# Patient Record
Sex: Male | Born: 1985 | Race: White | Hispanic: No | Marital: Married | Smoking: Current every day smoker
Health system: Southern US, Community
[De-identification: ages and names within clinical notes are randomized; demographics above are authoritative.]

## PROBLEM LIST (undated history)

## (undated) DIAGNOSIS — K219 Gastro-esophageal reflux disease without esophagitis: Secondary | ICD-10-CM

## (undated) DIAGNOSIS — F909 Attention-deficit hyperactivity disorder, unspecified type: Secondary | ICD-10-CM

## (undated) HISTORY — PX: TYMPANOPLASTY: SHX33

## (undated) HISTORY — PX: WISDOM TOOTH EXTRACTION: SHX21

---
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
## 2006-09-23 ENCOUNTER — Emergency Department (HOSPITAL_COMMUNITY): Admission: EM | Admit: 2006-09-23 | Discharge: 2006-09-23 | Payer: Self-pay | Admitting: Emergency Medicine

## 2009-07-06 ENCOUNTER — Emergency Department (HOSPITAL_COMMUNITY): Admission: EM | Admit: 2009-07-06 | Discharge: 2009-07-06 | Payer: Self-pay | Admitting: Emergency Medicine

## 2009-12-28 ENCOUNTER — Ambulatory Visit (HOSPITAL_COMMUNITY): Admission: RE | Admit: 2009-12-28 | Discharge: 2009-12-28 | Payer: Self-pay | Admitting: Psychiatry

## 2009-12-29 ENCOUNTER — Emergency Department (HOSPITAL_COMMUNITY)
Admission: EM | Admit: 2009-12-29 | Discharge: 2009-12-29 | Payer: Self-pay | Source: Home / Self Care | Admitting: Emergency Medicine

## 2010-04-03 ENCOUNTER — Emergency Department (HOSPITAL_COMMUNITY)
Admission: EM | Admit: 2010-04-03 | Discharge: 2010-04-03 | Payer: Self-pay | Source: Home / Self Care | Admitting: Emergency Medicine

## 2010-05-26 ENCOUNTER — Emergency Department (HOSPITAL_COMMUNITY)
Admission: EM | Admit: 2010-05-26 | Discharge: 2010-05-26 | Disposition: A | Discharge status: Home / Self Care | Payer: Self-pay | Attending: Emergency Medicine | Admitting: Emergency Medicine

## 2010-05-26 DIAGNOSIS — R220 Localized swelling, mass and lump, head: Secondary | ICD-10-CM | POA: Insufficient documentation

## 2010-05-26 DIAGNOSIS — K089 Disorder of teeth and supporting structures, unspecified: Secondary | ICD-10-CM | POA: Insufficient documentation

## 2010-05-26 DIAGNOSIS — R221 Localized swelling, mass and lump, neck: Secondary | ICD-10-CM | POA: Insufficient documentation

## 2010-05-26 DIAGNOSIS — K047 Periapical abscess without sinus: Secondary | ICD-10-CM | POA: Insufficient documentation

## 2010-05-26 DIAGNOSIS — K0381 Cracked tooth: Secondary | ICD-10-CM | POA: Insufficient documentation

## 2010-07-01 LAB — RAPID URINE DRUG SCREEN, HOSP PERFORMED
Amphetamines: NOT DETECTED
Barbiturates: NOT DETECTED
Benzodiazepines: NOT DETECTED
Cocaine: NOT DETECTED
Opiates: NOT DETECTED
Tetrahydrocannabinol: NOT DETECTED

## 2010-07-01 LAB — DIFFERENTIAL
Basophils Absolute: 0 10*3/uL (ref 0.0–0.1)
Basophils Relative: 0 % (ref 0–1)
Eosinophils Absolute: 0.2 10*3/uL (ref 0.0–0.7)
Monocytes Relative: 8 % (ref 3–12)
Neutro Abs: 8.4 10*3/uL — ABNORMAL HIGH (ref 1.7–7.7)
Neutrophils Relative %: 70 % (ref 43–77)

## 2010-07-01 LAB — BASIC METABOLIC PANEL
BUN: 10 mg/dL (ref 6–23)
CO2: 30 mEq/L (ref 19–32)
Calcium: 9.3 mg/dL (ref 8.4–10.5)
Chloride: 104 mEq/L (ref 96–112)
Creatinine, Ser: 1.07 mg/dL (ref 0.4–1.5)
GFR calc Af Amer: >60 mL/min (ref >60)
GFR calc non Af Amer: >60 mL/min (ref >60)
Glucose, Bld: 80 mg/dL (ref 70–99)
Potassium: 4.7 mEq/L (ref 3.5–5.1)
Sodium: 140 mEq/L (ref 135–145)

## 2010-07-01 LAB — CBC
HCT: 45.8 % (ref 39.0–52.0)
Hemoglobin: 16 g/dL (ref 13.0–17.0)
MCH: 31 pg (ref 26.0–34.0)
MCHC: 35 g/dL (ref 30.0–36.0)
MCV: 88.7 fL (ref 78.0–100.0)
Platelets: 293 K/uL (ref 150–400)
RBC: 5.16 MIL/uL (ref 4.22–5.81)
RDW: 13.8 % (ref 11.5–15.5)
WBC: 12.1 K/uL — ABNORMAL HIGH (ref 4.0–10.5)

## 2010-07-01 LAB — ETHANOL: Alcohol, Ethyl (B): <5 mg/dL (ref 0–10)

## 2010-07-12 LAB — URINALYSIS, ROUTINE W REFLEX MICROSCOPIC
Hgb urine dipstick: NEGATIVE
Nitrite: NEGATIVE
Protein, ur: NEGATIVE mg/dL
Specific Gravity, Urine: >1.03 — ABNORMAL HIGH (ref 1.005–1.030)
Urobilinogen, UA: 0.2 mg/dL (ref 0.0–1.0)

## 2010-08-05 ENCOUNTER — Emergency Department (HOSPITAL_COMMUNITY)
Admission: EM | Admit: 2010-08-05 | Discharge: 2010-08-05 | Disposition: A | Discharge status: Home / Self Care | Payer: Self-pay | Attending: Emergency Medicine | Admitting: Emergency Medicine

## 2010-08-05 DIAGNOSIS — K029 Dental caries, unspecified: Secondary | ICD-10-CM | POA: Insufficient documentation

## 2010-08-18 ENCOUNTER — Emergency Department (HOSPITAL_COMMUNITY)
Admission: EM | Admit: 2010-08-18 | Discharge: 2010-08-18 | Disposition: A | Discharge status: Home / Self Care | Payer: Self-pay | Attending: Emergency Medicine | Admitting: Emergency Medicine

## 2010-08-18 DIAGNOSIS — K029 Dental caries, unspecified: Secondary | ICD-10-CM | POA: Insufficient documentation

## 2010-08-18 DIAGNOSIS — K089 Disorder of teeth and supporting structures, unspecified: Secondary | ICD-10-CM | POA: Insufficient documentation
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
## 2010-09-03 NOTE — Discharge Summary (Signed)
NAME:  Dustin Rhodes, Dustin Rhodes                      ACCOUNT NO.:  [id number]   MEDICAL RECORD NO.:  [id number]                   PATIENT TYPE:  INP   LOCATION:  0203                                 FACILITY:  [facility]   PHYSICIAN:  Cindie Crumbly, M.D.               DATE OF BIRTH:  [birth year]   DATE OF ADMISSION:  07/26/2002  DATE OF DISCHARGE:  08/02/2002                                 DISCHARGE SUMMARY   REASON FOR ADMISSION:  This 25 year old white male was admitted for  inpatient psychiatric stabilization because of increasing symptoms of  depression and an attempt to overdose.  For further history of present  illness, please see the patient's psychiatric admission assessment.   PHYSICAL EXAMINATION:  At the time of admission was significant for obesity.  He had an otherwise unremarkable physical examination.   LABORATORY DATA:  The patient underwent a laboratory workup to rule out any  other medical problems contributing to his symptomatology.  Urine probe for  gonorrhea and chlamydia were negative.  Hepatic panel was within normal  limits.  UA was unremarkable.  CBC showed MCV of 80.6 and was otherwise  unremarkable.  GGT was within normal limits.  Free T4 and TSH were within  normal limits.  RPR was nonreactive.  The patient received no x-rays, no  special procedures, no additional consultations.  He sustained no  complications during the course of this hospitalization.   HOSPITAL COURSE:  On admission, the patient was psychomotor agitated.  Affect and mood were depressed, irritable and angry.  He was superficial,  guarded and avoidant.  He has remained in denial of his chemical dependency  issues throughout his hospital course.  He was begun on a trial of Zoloft  and tolerated this medication well without side effects.  At the time of  discharge, he denies any suicidal or homicidal ideation.  He is actively  participating in all aspects of the therapeutic treatment program.  He  continues to remain in denial of his chemical dependency issues.  However,  as he no longer appears to be a danger to himself or others, it is felt that  he has reached his maximum benefits of hospitalization and is ready for  discharge to a less restrictive alternative setting.   CONDITION ON DISCHARGE:  Improved.   DIAGNOSES (ACCORDING TO DSM-IV):   AXIS I:  1. Major depression, recurrent, severe without psychosis.  2. Rule out substance-induced mood disorder.  3. Conduct disorder.  4. Attention-deficit hyperactivity disorder.  5. Polysubstance dependence.   AXIS II:  Rule out personality disorder not otherwise specified.   AXIS III:  Obesity.   AXIS IV:  Severe.   AXIS V:  20 on admission; 30 on discharge.   FURTHER EVALUATION AND TREATMENT RECOMMENDATIONS:  1. The patient is discharged to home.  2. He is discharged on an unrestricted level of activity and a regular diet.  3. He is discharged  on Zoloft 50 mg p.o. q.d.  4. He will follow up with Dr. Mariana Single, his outpatient psychiatrist, at     [facility] for all further aspects of     his psychiatric care and, consequently, I will sign off on the case at     this time.                                               Cindie Crumbly, M.D.    TS/MEDQ  D:  08/02/2002  T:  08/03/2002  Job:  [id number]

## 2010-09-14 ENCOUNTER — Emergency Department (HOSPITAL_COMMUNITY)
Admission: EM | Admit: 2010-09-14 | Discharge: 2010-09-14 | Disposition: A | Discharge status: Home / Self Care | Payer: Self-pay | Attending: Emergency Medicine | Admitting: Emergency Medicine

## 2010-09-14 DIAGNOSIS — K089 Disorder of teeth and supporting structures, unspecified: Secondary | ICD-10-CM | POA: Insufficient documentation

## 2010-09-14 DIAGNOSIS — K047 Periapical abscess without sinus: Secondary | ICD-10-CM | POA: Insufficient documentation

## 2010-10-16 ENCOUNTER — Emergency Department (HOSPITAL_COMMUNITY)
Admission: EM | Admit: 2010-10-16 | Discharge: 2010-10-16 | Disposition: A | Discharge status: Home / Self Care | Payer: Self-pay | Attending: Emergency Medicine | Admitting: Emergency Medicine

## 2010-10-16 DIAGNOSIS — K029 Dental caries, unspecified: Secondary | ICD-10-CM | POA: Insufficient documentation

## 2010-10-16 DIAGNOSIS — L03211 Cellulitis of face: Secondary | ICD-10-CM | POA: Insufficient documentation

## 2010-10-16 DIAGNOSIS — L0201 Cutaneous abscess of face: Secondary | ICD-10-CM | POA: Insufficient documentation

## 2010-11-05 ENCOUNTER — Emergency Department (HOSPITAL_COMMUNITY)
Admission: EM | Admit: 2010-11-05 | Discharge: 2010-11-05 | Disposition: A | Discharge status: Home / Self Care | Payer: Self-pay | Attending: Emergency Medicine | Admitting: Emergency Medicine

## 2010-11-05 DIAGNOSIS — L738 Other specified follicular disorders: Secondary | ICD-10-CM | POA: Insufficient documentation

## 2010-11-05 DIAGNOSIS — F17200 Nicotine dependence, unspecified, uncomplicated: Secondary | ICD-10-CM | POA: Insufficient documentation

## 2010-11-05 DIAGNOSIS — K029 Dental caries, unspecified: Secondary | ICD-10-CM | POA: Insufficient documentation

## 2010-11-05 MED ORDER — CLINDAMYCIN HCL 150 MG PO CAPS
150.0000 mg | ORAL_CAPSULE | Freq: Once | ORAL | Status: AC
  Administered 2010-11-05: 150 mg via ORAL
  Filled 2010-11-05: qty 1

## 2010-11-05 MED ORDER — CLINDAMYCIN HCL 150 MG PO CAPS: 150.0000 mg | ORAL_CAPSULE | Freq: Four times a day (QID) | ORAL | Status: AC

## 2010-11-05 NOTE — ED Notes (Signed)
Pt presents with abscess to left side of jaw. Abscess appears to be on skin. Pt states he was at work and brushed his skin against shirt and blood and Pus started draining from jaw.

## 2010-11-05 NOTE — ED Notes (Signed)
Pt states he has a sore on the left side of his face that popped while he was at work. States he was told he had to come to the ed by his place of employment or he would be fired

## 2010-11-05 NOTE — ED Provider Notes (Signed)
History     Chief Complaint  Patient presents with  . Abscess   Patient is a 25 y.o. male presenting with abscess. The history is provided by the patient.  Abscess  This is a recurrent problem. Episode onset: He has an abscess of his left lower jawline in his beard.  It has been present for several weeks. It started draining today at work,  and did drain a moderate amount of pus.  He denies significant pain.  Has left lower fractured tooth with abscess,. Episode frequency: He was treated with amoxicillin for dental infection last week.  Still has not called his dentist for further treatment. The problem has been unchanged. The abscess is present on the face. Pertinent negatives include no fever, no congestion and no sore throat.    History reviewed. No pertinent past medical history.  History reviewed. No pertinent past surgical history.  History reviewed. No pertinent family history.  History  Substance Use Topics  . Smoking status: Current Everyday Smoker -- 1.0 packs/day  . Smokeless tobacco: Not on file  . Alcohol Use: No      Review of Systems  Constitutional: Negative for fever.  HENT: Positive for facial swelling. Negative for congestion, sore throat and neck pain.   Eyes: Negative.   Respiratory: Negative for chest tightness and shortness of breath.   Cardiovascular: Negative for chest pain.  Gastrointestinal: Negative for nausea and abdominal pain.  Genitourinary: Negative.   Musculoskeletal: Negative for joint swelling and arthralgias.  Skin: Negative.  Negative for rash and wound.  Neurological: Negative for dizziness, weakness, light-headedness, numbness and headaches.  Hematological: Negative.   Psychiatric/Behavioral: Negative.     Physical Exam  BP 140/86  Pulse 78  Temp(Src) 98.3 F (36.8 C) (Oral)  Resp 16  Ht 5\' 8"  (1.727 m)  Wt 207 lb (93.895 kg)  BMI 31.47 kg/m2  SpO2 100%  Physical Exam  Vitals reviewed. Constitutional: He is oriented to  person, place, and time. He appears well-developed and well-nourished.  HENT:  Head: Normocephalic and atraumatic.  Mouth/Throat: Uvula is midline and oropharynx is clear and moist. Dental caries present. No dental abscesses.       No appreciable mucosal swelling or induration.  Left lower 3rd molar with decay.  Eyes: Conjunctivae are normal.  Neck: Normal range of motion. Neck supple. No thyromegaly present.  Cardiovascular: Normal rate, regular rhythm, normal heart sounds and intact distal pulses.   Pulmonary/Chest: Effort normal and breath sounds normal. He has no wheezes.  Abdominal: Soft. Bowel sounds are normal. There is no tenderness.  Musculoskeletal: Normal range of motion.  Neurological: He is alert and oriented to person, place, and time.  Skin: Skin is warm and dry.       Two nodules left jawline,  Nontender,  One with large central punctum no active drainage,  Indurated without fluctuance.  0.5 cm diameter.  Psychiatric: He has a normal mood and affect.    ED Course  Procedures  MDM Small papule which has already actively drained,  No indication for I and D today.  Given recent dental history ,  Will cover with clindamycin.      Candis Musa, PA 11/05/10 [phone]

## 2010-12-08 NOTE — ED Provider Notes (Signed)
Medical screening examination/treatment/procedure(s) were performed by non-physician practitioner and as supervising physician I was immediately available for consultation/collaboration.   Sahasra Belue L Angelgabriel Willmore, MD 12/08/10 0908 

## 2011-01-03 ENCOUNTER — Emergency Department (HOSPITAL_COMMUNITY)
Admission: EM | Admit: 2011-01-03 | Discharge: 2011-01-03 | Disposition: A | Discharge status: Home / Self Care | Payer: Self-pay | Attending: Emergency Medicine | Admitting: Emergency Medicine

## 2011-01-03 ENCOUNTER — Encounter (HOSPITAL_COMMUNITY): Payer: Self-pay | Admitting: Emergency Medicine

## 2011-01-03 ENCOUNTER — Emergency Department (HOSPITAL_COMMUNITY): Payer: Self-pay

## 2011-01-03 DIAGNOSIS — J189 Pneumonia, unspecified organism: Secondary | ICD-10-CM | POA: Insufficient documentation

## 2011-01-03 DIAGNOSIS — R109 Unspecified abdominal pain: Secondary | ICD-10-CM | POA: Insufficient documentation

## 2011-01-03 DIAGNOSIS — D72829 Elevated white blood cell count, unspecified: Secondary | ICD-10-CM | POA: Insufficient documentation

## 2011-01-03 HISTORY — DX: Attention-deficit hyperactivity disorder, unspecified type: F90.9

## 2011-01-03 LAB — BASIC METABOLIC PANEL
Calcium: 10.2 mg/dL (ref 8.4–10.5)
Chloride: 96 mEq/L (ref 96–112)
Creatinine, Ser: 0.99 mg/dL (ref 0.50–1.35)
GFR calc Af Amer: >60 mL/min (ref >60)
Sodium: 137 mEq/L (ref 135–145)

## 2011-01-03 LAB — URINALYSIS, ROUTINE W REFLEX MICROSCOPIC
Ketones, ur: NEGATIVE mg/dL
Leukocytes, UA: NEGATIVE
Nitrite: NEGATIVE
Protein, ur: NEGATIVE mg/dL

## 2011-01-03 LAB — CBC
MCV: 86 fL (ref 78.0–100.0)
Platelets: 277 10*3/uL (ref 150–400)
RDW: 13 % (ref 11.5–15.5)
WBC: 13.3 10*3/uL — ABNORMAL HIGH (ref 4.0–10.5)

## 2011-01-03 MED ORDER — AZITHROMYCIN 250 MG PO TABS: 250.0000 mg | ORAL_TABLET | Freq: Every day | ORAL | Status: AC

## 2011-01-03 MED ORDER — ONDANSETRON 4 MG PO TBDP
4.0000 mg | ORAL_TABLET | Freq: Once | ORAL | Status: AC
  Administered 2011-01-03: 4 mg via ORAL
  Filled 2011-01-03: qty 1

## 2011-01-03 MED ORDER — AZITHROMYCIN 250 MG PO TABS
500.0000 mg | ORAL_TABLET | Freq: Once | ORAL | Status: AC
  Administered 2011-01-03: 500 mg via ORAL
  Filled 2011-01-03: qty 2

## 2011-01-03 MED ORDER — HYDROCODONE-ACETAMINOPHEN 5-325 MG PO TABS: 1.0000 | ORAL_TABLET | ORAL | Status: AC | PRN

## 2011-01-03 MED ORDER — HYDROCODONE-ACETAMINOPHEN 5-325 MG PO TABS
1.0000 | ORAL_TABLET | Freq: Once | ORAL | Status: AC
  Administered 2011-01-03: 1 via ORAL
  Filled 2011-01-03: qty 1

## 2011-01-03 NOTE — ED Notes (Signed)
Into room to see patient. Resting in bed on back. Pain 2\10 at this time. Denies any needs. Watching tv. Call bell at bedside. Will continue to monitor.

## 2011-01-03 NOTE — ED Provider Notes (Signed)
History     CSN: [id number] Arrival date & time: 01/03/2011  5:14 PM   Chief Complaint  Patient presents with  . Flank Pain     (Include location/radiation/quality/duration/timing/severity/associated sxs/prior treatment) Patient is a 25 y.o. male presenting with flank pain. The history is provided by the patient.  Flank Pain The current episode started 6 to 12 hours ago. The problem occurs constantly. The problem has been gradually worsening. Associated symptoms include abdominal pain. Pertinent negatives include no chest pain, no headaches and no shortness of breath. The symptoms are relieved by nothing.   RIGHT FLANK ONSET AS ACHE AT 1000 THIS AM BUT WORSE AT 1330 AND WAS SEVERE BUT NOW BACK TO ACHE. ASSOCIATED WITH COUGH. BUT NOT CP. THINKS HE HAS A KIDNEY STONE.    Past Medical History  Diagnosis Date  . ADHD (attention deficit hyperactivity disorder)      Past Surgical History  Procedure Date  . Tympanoplasty   . Wisdom tooth extraction     Family History  Problem Relation Age of Onset  . Asthma Mother   . Migraines Mother   . Cancer Other   . Heart attack Other   . Alzheimer's disease Other     History  Substance Use Topics  . Smoking status: Current Everyday Smoker -- 1.0 packs/day for 10 years    Types: Cigarettes  . Smokeless tobacco: Former Neurosurgeon  . Alcohol Use: No      Review of Systems  Constitutional: Negative for fever.  HENT: Negative for neck pain.   Eyes: Negative for redness.  Respiratory: Positive for cough. Negative for shortness of breath.   Cardiovascular: Negative for chest pain.  Gastrointestinal: Positive for nausea and abdominal pain. Negative for vomiting and diarrhea.  Genitourinary: Positive for hematuria and flank pain. Negative for dysuria.  Musculoskeletal: Positive for back pain.  Neurological: Negative for weakness and headaches.  Hematological: Negative for adenopathy.    Allergies  Tramadol  Home Medications    Current Outpatient Rx  Name Route Sig Dispense Refill  . CLEAR EYES OP Both Eyes Place 1 drop into both eyes daily.      . AZITHROMYCIN 250 MG PO TABS Oral Take 1 tablet (250 mg total) by mouth daily. Take first 2 tablets together, then 1 every day until finished. 6 tablet 0  . HYDROCODONE-ACETAMINOPHEN 5-325 MG PO TABS Oral Take 1-2 tablets by mouth every 4 (four) hours as needed for pain. 10 tablet 0    Physical Exam    BP 135/90  Pulse 104  Temp(Src) 98.8 F (37.1 C) (Oral)  Resp 18  Ht 5\' 8"  (1.727 m)  Wt 215 lb (97.523 kg)  BMI 32.69 kg/m2  SpO2 98%  Physical Exam  Nursing note and vitals reviewed. Constitutional: He is oriented to person, place, and time. He appears well-developed and well-nourished.  HENT:  Head: Normocephalic and atraumatic.  Mouth/Throat: Oropharynx is clear and moist.  Eyes: Conjunctivae are normal. Pupils are equal, round, and reactive to light.  Neck: Normal range of motion. Neck supple.  Cardiovascular: Normal rate, regular rhythm and normal heart sounds.   Pulmonary/Chest: Effort normal and breath sounds normal. He has no wheezes. He has no rales. He exhibits no tenderness.  Abdominal: Soft. Bowel sounds are normal. There is no tenderness.  Musculoskeletal: Normal range of motion. He exhibits no edema.  Neurological: He is alert and oriented to person, place, and time.  Skin: Skin is dry. No rash noted.    ED Course  Procedures  Results for orders placed during the hospital encounter of 01/03/11  CBC      Component Value Range   WBC 13.3 (*) 4.0 - 10.5 (K/uL)   RBC 5.70  4.22 - 5.81 (MIL/uL)   Hemoglobin 17.2 (*) 13.0 - 17.0 (g/dL)   HCT 16.1  09.6 - 04.5 (%)   MCV 86.0  78.0 - 100.0 (fL)   MCH 30.2  26.0 - 34.0 (pg)   MCHC 35.1  30.0 - 36.0 (g/dL)   RDW 40.9  81.1 - 91.4 (%)   Platelets 277  150 - 400 (K/uL)  BASIC METABOLIC PANEL      Component Value Range   Sodium 137  135 - 145 (mEq/L)   Potassium 4.0  3.5 - 5.1 (mEq/L)    Chloride 96  96 - 112 (mEq/L)   CO2 25  19 - 32 (mEq/L)   Glucose, Bld 71  70 - 99 (mg/dL)   BUN 16  6 - 23 (mg/dL)   Creatinine, Ser 7.82  0.50 - 1.35 (mg/dL)   Calcium 95.6  8.4 - 10.5 (mg/dL)   GFR calc non Af Amer >60  >60 (mL/min)   GFR calc Af Amer >60  >60 (mL/min)  URINALYSIS, ROUTINE W REFLEX MICROSCOPIC      Component Value Range   Color, Urine YELLOW  YELLOW    Appearance CLEAR  CLEAR    Specific Gravity, Urine >1.030 (*) 1.005 - 1.030    pH 6.0  5.0 - 8.0    Glucose, UA NEGATIVE  NEGATIVE (mg/dL)   Hgb urine dipstick TRACE (*) NEGATIVE    Bilirubin Urine SMALL (*) NEGATIVE    Ketones, ur NEGATIVE  NEGATIVE (mg/dL)   Protein, ur NEGATIVE  NEGATIVE (mg/dL)   Urobilinogen, UA 0.2  0.0 - 1.0 (mg/dL)   Nitrite NEGATIVE  NEGATIVE    Leukocytes, UA NEGATIVE  NEGATIVE   URINE MICROSCOPIC-ADD ON      Component Value Range   Squamous Epithelial / LPF FEW (*) RARE    WBC, UA 0-2  <3 (WBC/hpf)   RBC / HPF 0-2  <3 (RBC/hpf)   Bacteria, UA FEW (*) RARE    Casts HYALINE CASTS (*) NEGATIVE    Ct Abdomen Pelvis Wo Contrast  01/03/2011  *RADIOLOGY REPORT*  Clinical Data: 25 year old male with right-sided abdominal and pelvic pain.  CT ABDOMEN AND PELVIS WITHOUT CONTRAST  Technique:  Multidetector CT imaging of the abdomen and pelvis was performed following the standard protocol without intravenous contrast.  Comparison: None  Findings: Right lower lobe airspace disease identified compatible with pneumonia.  The liver, spleen, adrenal glands, pancreas, gallbladder and kidneys are unremarkable.  Please note that parenchymal abnormalities may be missed as intravenous contrast was not administered. No free fluid, enlarged lymph nodes, biliary dilation or abdominal aortic aneurysm identified.  The bowel, appendix and bladder are unremarkable.  No acute or suspicious bony abnormalities are noted.  IMPRESSION: Right lower lobe pneumonia.  No abnormalities identified within the abdomen or pelvis.   Original Report Authenticated By: Rosendo Gros, M.D.     1. Community acquired pneumonia      MDM CT DEPICT RLL PNEUMONIA WHICH COULD EXPLAIN RIGHT FLANK BACK CVA PAIN. ALONG WITH ELEVATED WBC. CLINICALLY STABLE AND CAN BE TREATED AS AN OUT PATIENT.        [name], MD 01/03/11 2006

## 2011-01-03 NOTE — ED Notes (Signed)
Patient report given to Clinical research associate. Assuming care of patient. Patient back to room from radiology. Pain 3\10. Denies nausea. Denies any needs. Call bell at bedside. Notified awaiting ct results. Verbalized understanding.

## 2011-01-03 NOTE — ED Notes (Signed)
Pt reports rt flank pain that began this a.m.  Pt denies any dysuria but states "my urine is darker than usual".  Pt reports pain "comes and goes".

## 2011-01-03 NOTE — ED Notes (Signed)
MD at bedside to speak with patient about plan of care.

## 2011-01-03 NOTE — ED Notes (Signed)
Pt c/o sudden onset of R side flank pain today. Became very diaphoretic and reports he "almost passed out." Pain has decreased at present but is still a dull pain. Increases with activity. L side flank "aches."

## 2011-01-03 NOTE — ED Notes (Signed)
Patient does not need anything at this time. 

## 2011-05-18 ENCOUNTER — Emergency Department (HOSPITAL_COMMUNITY)
Admission: EM | Admit: 2011-05-18 | Discharge: 2011-05-18 | Disposition: A | Discharge status: Home / Self Care | Payer: Self-pay | Attending: Emergency Medicine | Admitting: Emergency Medicine

## 2011-05-18 ENCOUNTER — Encounter (HOSPITAL_COMMUNITY): Payer: Self-pay | Admitting: *Deleted

## 2011-05-18 DIAGNOSIS — F17200 Nicotine dependence, unspecified, uncomplicated: Secondary | ICD-10-CM | POA: Insufficient documentation

## 2011-05-18 DIAGNOSIS — R197 Diarrhea, unspecified: Secondary | ICD-10-CM | POA: Insufficient documentation

## 2011-05-18 DIAGNOSIS — R112 Nausea with vomiting, unspecified: Secondary | ICD-10-CM | POA: Insufficient documentation

## 2011-05-18 DIAGNOSIS — R1110 Vomiting, unspecified: Secondary | ICD-10-CM

## 2011-05-18 MED ORDER — PROMETHAZINE HCL 25 MG PO TABS: 25.0000 mg | ORAL_TABLET | Freq: Four times a day (QID) | ORAL | Status: AC | PRN

## 2011-05-18 NOTE — ED Provider Notes (Signed)
Medical screening examination/treatment/procedure(s) were performed by non-physician practitioner and as supervising physician I was immediately available for consultation/collaboration.  Donnetta Hutching, MD 05/18/11 [phone]

## 2011-05-18 NOTE — ED Provider Notes (Signed)
History     CSN: [id number]  Arrival date & time 05/18/11  1404   First MD Initiated Contact with Patient 05/18/11 1416      Chief Complaint  Patient presents with  . Emesis    (Consider location/radiation/quality/duration/timing/severity/associated sxs/prior treatment) Patient is a 26 y.o. male presenting with vomiting. The history is provided by the patient. No language interpreter was used.  Emesis  This is a new problem. Episode onset: this AM.  states he vomited about 5-6 times and has had 1 episode of diarrhea..  feels much better at exam time. The problem has been resolved. There has been no fever. Associated symptoms include diarrhea. Pertinent negatives include no fever.    Past Medical History  Diagnosis Date  . ADHD (attention deficit hyperactivity disorder)     Past Surgical History  Procedure Date  . Tympanoplasty   . Wisdom tooth extraction     Family History  Problem Relation Age of Onset  . Asthma Mother   . Migraines Mother   . Cancer Other   . Heart attack Other   . Alzheimer's disease Other     History  Substance Use Topics  . Smoking status: Current Everyday Smoker -- 1.0 packs/day for 10 years    Types: Cigarettes  . Smokeless tobacco: Former Neurosurgeon  . Alcohol Use: No      Review of Systems  Constitutional: Negative for fever.  Gastrointestinal: Positive for nausea, vomiting and diarrhea.  All other systems reviewed and are negative.    Allergies  Tramadol  Home Medications   Current Outpatient Rx  Name Route Sig Dispense Refill  . CLEAR EYES OP Both Eyes Place 1 drop into both eyes daily.       BP 150/93  Pulse 84  Temp(Src) 98.1 F (36.7 C) (Oral)  Resp 18  Ht 5\' 8"  (1.727 m)  Wt 225 lb (102.059 kg)  BMI 34.21 kg/m2  SpO2 99%  Physical Exam  Nursing note and vitals reviewed. Constitutional: He is oriented to person, place, and time. He appears well-developed and well-nourished.  HENT:  Head: Normocephalic and  atraumatic.  Mouth/Throat: Oropharynx is clear and moist. No oropharyngeal exudate.  Eyes: EOM are normal.  Neck: Normal range of motion.  Cardiovascular: Normal rate, regular rhythm, normal heart sounds and intact distal pulses.   Pulmonary/Chest: Effort normal and breath sounds normal. No respiratory distress.  Abdominal: Soft. Normal appearance and bowel sounds are normal. He exhibits no distension. There is no tenderness. There is no rebound and no guarding.  Musculoskeletal: Normal range of motion.  Neurological: He is alert and oriented to person, place, and time.  Skin: Skin is warm and dry.  Psychiatric: He has a normal mood and affect. Judgment normal.    ED Course  Procedures (including critical care time)  Labs Reviewed - No data to display No results found.   No diagnosis found.    MDM          Worthy Rancher, PA 05/18/11 [phone]

## 2011-05-18 NOTE — ED Notes (Signed)
Nausea, vomiting, diarrhea, headache, chills

## 2011-06-16 ENCOUNTER — Emergency Department (HOSPITAL_COMMUNITY)
Admission: EM | Admit: 2011-06-16 | Discharge: 2011-06-16 | Disposition: A | Discharge status: Home / Self Care | Payer: Self-pay | Attending: Emergency Medicine | Admitting: Emergency Medicine

## 2011-06-16 ENCOUNTER — Encounter (HOSPITAL_COMMUNITY): Payer: Self-pay

## 2011-06-16 DIAGNOSIS — K219 Gastro-esophageal reflux disease without esophagitis: Secondary | ICD-10-CM | POA: Insufficient documentation

## 2011-06-16 DIAGNOSIS — J069 Acute upper respiratory infection, unspecified: Secondary | ICD-10-CM | POA: Insufficient documentation

## 2011-06-16 DIAGNOSIS — H6690 Otitis media, unspecified, unspecified ear: Secondary | ICD-10-CM | POA: Insufficient documentation

## 2011-06-16 HISTORY — DX: Gastro-esophageal reflux disease without esophagitis: K21.9

## 2011-06-16 MED ORDER — AMOXICILLIN 500 MG PO CAPS: 500.0000 mg | ORAL_CAPSULE | Freq: Three times a day (TID) | ORAL | Status: AC

## 2011-06-16 NOTE — ED Provider Notes (Signed)
History     CSN: [id number]  Arrival date & time 06/16/11  4098   First MD Initiated Contact with Patient 06/16/11 1845      Chief Complaint  Patient presents with  . Emesis    (Consider location/radiation/quality/duration/timing/severity/associated sxs/prior treatment) HPI Comments: Was told by work to come here.  Patient is a 26 y.o. male presenting with cough. The history is provided by the patient.  Cough This is a new problem. The current episode started yesterday. The problem occurs constantly. The problem has been gradually worsening. The cough is non-productive. There has been no fever. Pertinent negatives include no shortness of breath. He has tried nothing for the symptoms. He is not a smoker.    Past Medical History  Diagnosis Date  . ADHD (attention deficit hyperactivity disorder)   . Acid reflux     Past Surgical History  Procedure Date  . Tympanoplasty   . Wisdom tooth extraction     Family History  Problem Relation Age of Onset  . Asthma Mother   . Migraines Mother   . Cancer Other   . Heart attack Other   . Alzheimer's disease Other     History  Substance Use Topics  . Smoking status: Current Everyday Smoker -- 1.0 packs/day for 10 years    Types: Cigarettes  . Smokeless tobacco: Former Neurosurgeon  . Alcohol Use: No      Review of Systems  Respiratory: Positive for cough. Negative for shortness of breath.   All other systems reviewed and are negative.    Allergies  Tramadol  Home Medications   Current Outpatient Rx  Name Route Sig Dispense Refill  . CLEAR EYES OP Both Eyes Place 1 drop into both eyes daily.       BP 139/75  Pulse 80  Temp 97.5 F (36.4 C)  Resp 20  Ht 5\' 8"  (1.727 m)  Wt 234 lb (106.142 kg)  BMI 35.58 kg/m2  SpO2 98%  Physical Exam  Nursing note and vitals reviewed. Constitutional: He is oriented to person, place, and time. He appears well-developed and well-nourished. No distress.  HENT:  Head:  Normocephalic and atraumatic.  Left Ear: External ear normal.       The right TM is moderately erythematous.  Neck: Normal range of motion. Neck supple.  Cardiovascular: Normal rate and regular rhythm.   Pulmonary/Chest: Effort normal and breath sounds normal. No respiratory distress.  Abdominal: Soft. Bowel sounds are normal. He exhibits no distension. There is no tenderness.  Musculoskeletal: Normal range of motion. He exhibits no edema.  Lymphadenopathy:    He has no cervical adenopathy.  Neurological: He is alert and oriented to person, place, and time.  Skin: Skin is warm and dry. He is not diaphoretic.    ED Course  Procedures (including critical care time)  Labs Reviewed - No data to display No results found.   No diagnosis found.    MDM  Likely viral, possible early otitis media.  Will give Rx for amox to fill if ear not improving or if worsens.          Geoffery Lyons, MD 06/16/11 1901

## 2011-06-16 NOTE — Discharge Instructions (Signed)

## 2011-06-16 NOTE — ED Notes (Signed)
Nausea, vomiting fever and aching

## 2011-08-12 ENCOUNTER — Encounter (HOSPITAL_COMMUNITY): Payer: Self-pay | Admitting: *Deleted

## 2011-08-12 ENCOUNTER — Emergency Department (HOSPITAL_COMMUNITY)
Admission: EM | Admit: 2011-08-12 | Discharge: 2011-08-12 | Disposition: A | Discharge status: Home / Self Care | Payer: Self-pay | Attending: Emergency Medicine | Admitting: Emergency Medicine

## 2011-08-12 DIAGNOSIS — K111 Hypertrophy of salivary gland: Secondary | ICD-10-CM

## 2011-08-12 DIAGNOSIS — R599 Enlarged lymph nodes, unspecified: Secondary | ICD-10-CM | POA: Insufficient documentation

## 2011-08-12 DIAGNOSIS — M26609 Unspecified temporomandibular joint disorder, unspecified side: Secondary | ICD-10-CM

## 2011-08-12 DIAGNOSIS — F17200 Nicotine dependence, unspecified, uncomplicated: Secondary | ICD-10-CM | POA: Insufficient documentation

## 2011-08-12 DIAGNOSIS — F909 Attention-deficit hyperactivity disorder, unspecified type: Secondary | ICD-10-CM | POA: Insufficient documentation

## 2011-08-12 DIAGNOSIS — K219 Gastro-esophageal reflux disease without esophagitis: Secondary | ICD-10-CM | POA: Insufficient documentation

## 2011-08-12 NOTE — ED Provider Notes (Signed)
History     CSN: [id number]  Arrival date & time 08/12/11  7829   First MD Initiated Contact with Patient 08/12/11 0545      Chief Complaint  Patient presents with  . Cyst    (Consider location/radiation/quality/duration/timing/severity/associated sxs/prior treatment) The history is provided by the patient.   26 year old male noticed a knot underneath his jaw on the left side. He was shaving yesterday when he noticed a knot. He is concerned because his father had a staph infection and had to have many teeth resected because of that. He denies pain and denies fever. He is also complaining that his jaw pops on the left side when he opens and closes his mouth. Sometimes when he opens his mouth too far the muscles go into spasm and it is very painful.  Past Medical History  Diagnosis Date  . ADHD (attention deficit hyperactivity disorder)   . Acid reflux     Past Surgical History  Procedure Date  . Tympanoplasty   . Wisdom tooth extraction     Family History  Problem Relation Age of Onset  . Asthma Mother   . Migraines Mother   . Cancer Other   . Heart attack Other   . Alzheimer's disease Other     History  Substance Use Topics  . Smoking status: Current Everyday Smoker -- 1.0 packs/day for 10 years    Types: Cigarettes  . Smokeless tobacco: Former Neurosurgeon  . Alcohol Use: No      Review of Systems  All other systems reviewed and are negative.    Allergies  Tramadol  Home Medications   Current Outpatient Rx  Name Route Sig Dispense Refill  . CLEAR EYES OP Both Eyes Place 1 drop into both eyes daily.       BP 150/108  Pulse 66  Temp(Src) 97.7 F (36.5 C) (Oral)  Resp 18  Ht 5\' 8"  (1.727 m)  Wt 225 lb (102.059 kg)  BMI 34.21 kg/m2  SpO2 100%  Physical Exam  Nursing note and vitals reviewed.  26 year old male who is resting comfortably and in no acute distress. Vital signs are significant for mild hypertension with blood pressure 150/108. Oxygen  saturation is 100% which is normal. Head is normocephalic and atraumatic. PERRLA, EOMI. Oropharynx is clear. Some slight subluxation is noted of the left TMJ with opening and closing his mouth. There is no tenderness palpation over the TMJ. There is a small left submandibular lymph node palpable. The lymph gland is less than 1 cm. There is no tenderness and no erythema the overlying skin and no warmth. Neck is nontender and supple without other adenopathy. Back is nontender. Lungs are clear without rales, wheezes, rhonchi. Heart has regular rate and rhythm without murmur. Abdomen is soft, flat, nontender without masses or hepatosplenomegaly. Extremities have full range of motion, no cyanosis or edema. Skin is warm and dry without rash. Neurologic: Mental status is normal but he is quite anxious. Cranial nerves are intact. There no focal motor or sensory deficits.  ED Course  Procedures (including critical care time)  Labs Reviewed - No data to display No results found.   No diagnosis found.    MDM  Slightly enlarged submandibular lymph node without evidence of infection. Mild dysfunction of TMJ. It sounds like he sometimes has dislocation of the TMJ which resolved spontaneously. He is cautioned against opening his mouth 2 widely. Is advised to return only should the lymph gland to show signs of infection. He seems  quite anxious about this so he is given a referral to Dr. Suszanne Conners for second opinion. Of note, he is a cigarette smoker and is advised to stop smoking.        Dione Booze, MD 08/12/11 [phone]

## 2011-08-12 NOTE — ED Notes (Signed)
Pt noticed a knot on the left lower jaw. Also states can hear a popping sound when open mouth.

## 2011-08-12 NOTE — Discharge Instructions (Signed)
Do not do feel in your neck is a lymph gland. It is not seen to be infected at this time, so antibiotics are not needed today. If it gets bigger or develops redness or becomes painful, then you would need to be on antibiotics at that point.  Temporomandibular Problems  Temporomandibular joint (TMJ) dysfunction means there are problems with the joint between your jaw and your skull. This is a joint lined by cartilage like other joints in your body but also has a small disc in the joint which keeps the bones from rubbing on each other. These joints are like other joints and can get inflamed (sore) from arthritis and other problems. When this joint gets sore, it can cause headaches and pain in the jaw and the face. CAUSES  Usually the arthritic types of problems are caused by soreness in the joint. Soreness in the joint can also be caused by overuse. This may come from grinding your teeth. It may also come from mis-alignment in the joint. DIAGNOSIS Diagnosis of this condition can often be made by history and exam. Sometimes your caregiver may need X-rays or an MRI scan to determine the exact cause. It may be necessary to see your dentist to determine if your teeth and jaws are lined up correctly. TREATMENT  Most of the time this problem is not serious; however, sometimes it can persist (become chronic). When this happens medications that will cut down on inflammation (soreness) help. Sometimes a shot of cortisone into the joint will be helpful. If your teeth are not aligned it may help for your dentist to make a splint for your mouth that can help this problem. If no physical problems can be found, the problem may come from tension. If tension is found to be the cause, biofeedback or relaxation techniques may be helpful. HOME CARE INSTRUCTIONS   Later in the day, applications of ice packs may be helpful. Ice can be used in a plastic bag with a towel around it to prevent frostbite to skin. This may be used  about every 2 hours for 20 to 30 minutes, as needed while awake, or as directed by your caregiver.   Only take over-the-counter or prescription medicines for pain, discomfort, or fever as directed by your caregiver.   If physical therapy was prescribed, follow your caregiver's directions.   Wear mouth appliances as directed if they were given.  Document Released: 12/28/2000 Document Revised: 03/24/2011 Document Reviewed: 04/06/2008 [facility] Patient Information 2012 [place], [place].  Smoking Hazards Smoking cigarettes is extremely bad for your health. Tobacco smoke has over 200 known poisons in it. There are over 60 chemicals in tobacco smoke that cause cancer. Some of the chemicals found in cigarette smoke include:   Cyanide.   Benzene.   Formaldehyde.   Methanol (wood alcohol).   Acetylene (fuel used in welding torches).   Ammonia.  Cigarette smoke also contains the poisonous gases nitrogen oxide and carbon monoxide.  Cigarette smokers have an increased risk of many serious medical problems, including:  Lung cancer.   Lung disease (such as pneumonia, bronchitis, and emphysema).   Heart attack and chest pain due to the heart not getting enough oxygen (angina).   Heart disease and peripheral blood vessel disease.   Hypertension.   Stroke.   Oral cancer (cancer of the lip, mouth, or voice box).   Bladder cancer.   Pancreatic cancer.   Cervical cancer.   Pregnancy complications, including premature birth.   Low birthweight babies.  Early menopause.   Lower estrogen level for women.   Infertility.   Facial wrinkles.   Blindness.   Increased risk of broken bones (fractures).   Senile dementia.   Stillbirths and smaller newborn babies, birth defects, and genetic damage to sperm.   Stomach ulcers and internal bleeding.  Children of smokers have an increased risk of the following, because of secondhand smoke exposure:   Sudden infant death syndrome  (SIDS).   Respiratory infections.   Lung cancer.   Heart disease.   Ear infections.  Smoking causes approximately:  90% of all lung cancer deaths in men.   80% of all lung cancer deaths in women.   90% of deaths from chronic obstructive lung disease.  Compared with nonsmokers, smoking increases the risk of:  Coronary heart disease by 2 to 4 times.   Stroke by 2 to 4 times.   Men developing lung cancer by 23 times.   Women developing lung cancer by 13 times.   Dying from chronic obstructive lung diseases by 12 times.  Someone who smokes 2 packs a day loses about 8 years of his or her life. Even smoking lightly shortens your life expectancy by several years. You can greatly reduce the risk of medical problems for you and your family by stopping now. Smoking is the most preventable cause of death and disease in our society. Within days of quitting smoking, your circulation returns to normal, you decrease the risk of having a heart attack, and your lung capacity improves. There may be some increased phlegm in the first few days after quitting, and it may take months for your lungs to clear up completely. Quitting for 10 years cuts your lung cancer risk to almost that of a nonsmoker. WHY IS SMOKING ADDICTIVE?  Nicotine is the chemical agent in tobacco that is capable of causing addiction or dependence.   When you smoke and inhale, nicotine is absorbed rapidly into the bloodstream through your lungs. Nicotine absorbed through the lungs is capable of creating a powerful addiction. Both inhaled and non-inhaled nicotine may be addictive.   Addiction studies of cigarettes and spit tobacco show that addiction to nicotine occurs mainly during the teen years, when young people begin using tobacco products.  WHAT ARE THE BENEFITS OF QUITTING?  There are many health benefits to quitting smoking.   Likelihood of developing cancer and heart disease decreases. Health improvements are seen almost  immediately.   Blood pressure, pulse rate, and breathing patterns start returning to normal soon after quitting.   People who quit may see an improvement in their overall quality of life.  Some people choose to quit all at once. Other options include nicotine replacement products, such as patches, gum, and nasal sprays. Do not use these products without first checking with your caregiver. QUITTING SMOKING It is not easy to quit smoking. Nicotine is addicting, and longtime habits are hard to change. To start, you can write down all your reasons for quitting, tell your family and friends you want to quit, and ask for their help. Throw your cigarettes away, chew gum or cinnamon sticks, keep your hands busy, and drink extra water or juice. Go for walks and practice deep breathing to relax. Think of all the money you are saving: around $1,000 a year, for the average pack-a-day smoker. Nicotine patches and gum have been shown to improve success at efforts to stop smoking. Zyban (bupropion) is an anti-depressant drug that can be prescribed to reduce nicotine withdrawal  symptoms and to suppress the urge to smoke. Smoking is an addiction with both physical and psychological effects. Joining a stop-smoking support group can help you cope with the emotional issues. For more information and advice on programs to stop smoking, call your doctor, your local hospital, or these organizations:  American Lung Association - 1-800-LUNGUSA   American Cancer Society - 1-800-ACS-2345  Document Released: 05/12/2004 Document Revised: 03/24/2011 Document Reviewed: 01/14/2009 [facility] Patient Information 2012 [place], [place].

## 2011-08-17 ENCOUNTER — Emergency Department (HOSPITAL_COMMUNITY)
Admission: EM | Admit: 2011-08-17 | Discharge: 2011-08-17 | Disposition: A | Discharge status: Home / Self Care | Payer: Self-pay | Attending: Emergency Medicine | Admitting: Emergency Medicine

## 2011-08-17 ENCOUNTER — Encounter (HOSPITAL_COMMUNITY): Payer: Self-pay

## 2011-08-17 DIAGNOSIS — K5289 Other specified noninfective gastroenteritis and colitis: Secondary | ICD-10-CM | POA: Insufficient documentation

## 2011-08-17 DIAGNOSIS — R112 Nausea with vomiting, unspecified: Secondary | ICD-10-CM | POA: Insufficient documentation

## 2011-08-17 DIAGNOSIS — K219 Gastro-esophageal reflux disease without esophagitis: Secondary | ICD-10-CM | POA: Insufficient documentation

## 2011-08-17 DIAGNOSIS — R51 Headache: Secondary | ICD-10-CM | POA: Insufficient documentation

## 2011-08-17 DIAGNOSIS — R109 Unspecified abdominal pain: Secondary | ICD-10-CM | POA: Insufficient documentation

## 2011-08-17 DIAGNOSIS — R197 Diarrhea, unspecified: Secondary | ICD-10-CM | POA: Insufficient documentation

## 2011-08-17 DIAGNOSIS — K529 Noninfective gastroenteritis and colitis, unspecified: Secondary | ICD-10-CM

## 2011-08-17 DIAGNOSIS — F17200 Nicotine dependence, unspecified, uncomplicated: Secondary | ICD-10-CM | POA: Insufficient documentation

## 2011-08-17 LAB — URINALYSIS, ROUTINE W REFLEX MICROSCOPIC
Ketones, ur: NEGATIVE mg/dL
Leukocytes, UA: NEGATIVE
Nitrite: NEGATIVE
pH: 7.5 (ref 5.0–8.0)

## 2011-08-17 LAB — CBC
HCT: 45.2 % (ref 39.0–52.0)
Hemoglobin: 15.4 g/dL (ref 13.0–17.0)
MCH: 29.9 pg (ref 26.0–34.0)
MCV: 87.8 fL (ref 78.0–100.0)
RBC: 5.15 MIL/uL (ref 4.22–5.81)

## 2011-08-17 LAB — COMPREHENSIVE METABOLIC PANEL
Alkaline Phosphatase: 47 U/L (ref 39–117)
BUN: 17 mg/dL (ref 6–23)
Calcium: 9.5 mg/dL (ref 8.4–10.5)
GFR calc Af Amer: >90 mL/min (ref >90)
Glucose, Bld: 109 mg/dL — ABNORMAL HIGH (ref 70–99)
Potassium: 4.5 mEq/L (ref 3.5–5.1)
Total Protein: 7.4 g/dL (ref 6.0–8.3)

## 2011-08-17 LAB — DIFFERENTIAL
Eosinophils Absolute: 0.3 10*3/uL (ref 0.0–0.7)
Eosinophils Relative: 3 % (ref 0–5)
Lymphs Abs: 2.9 10*3/uL (ref 0.7–4.0)
Monocytes Absolute: 0.6 10*3/uL (ref 0.1–1.0)
Monocytes Relative: 6 % (ref 3–12)

## 2011-08-17 MED ORDER — ONDANSETRON HCL 4 MG PO TABS: 4.0000 mg | ORAL_TABLET | Freq: Three times a day (TID) | ORAL | Status: AC | PRN

## 2011-08-17 MED ORDER — SODIUM CHLORIDE 0.9 % IV BOLUS (SEPSIS)
1000.0000 mL | Freq: Once | INTRAVENOUS | Status: AC
  Administered 2011-08-17: 1000 mL via INTRAVENOUS

## 2011-08-17 MED ORDER — ONDANSETRON HCL 4 MG/2ML IJ SOLN
4.0000 mg | Freq: Once | INTRAMUSCULAR | Status: AC
  Administered 2011-08-17: 4 mg via INTRAVENOUS
  Filled 2011-08-17: qty 2

## 2011-08-17 MED ORDER — KETOROLAC TROMETHAMINE 30 MG/ML IJ SOLN
30.0000 mg | Freq: Once | INTRAMUSCULAR | Status: AC
  Administered 2011-08-17: 30 mg via INTRAVENOUS
  Filled 2011-08-17: qty 1

## 2011-08-17 NOTE — Discharge Instructions (Signed)
Diet for Diarrhea, Adult Having frequent, runny stools (diarrhea) has many causes. Diarrhea may be caused or worsened by food or drink. Diarrhea may be relieved by changing your diet. IF YOU ARE NOT TOLERATING SOLID FOODS:  Drink enough water and fluids to keep your urine clear or pale yellow.   Avoid sugary drinks and sodas as well as milk-based beverages.   Avoid beverages containing caffeine and alcohol.   You may try rehydrating beverages. You can make your own by following this recipe:    tsp table salt.    tsp baking soda.   ? tsp salt substitute (potassium chloride).   1 tbs + 1 tsp sugar.   1 qt water.  As your stools become more solid, you can start eating solid foods. Add foods one at a time. If a certain food causes your diarrhea to get worse, avoid that food and try other foods. A low fiber, low-fat, and lactose-free diet is recommended. Small, frequent meals may be better tolerated.  Starches  Allowed:  White, French, and pita breads, plain rolls, buns, bagels. Plain muffins, matzo. Soda, saltine, or graham crackers. Pretzels, melba toast, zwieback. Cooked cereals made with water: cornmeal, farina, cream cereals. Dry cereals: refined corn, wheat, rice. Potatoes prepared any way without skins, refined macaroni, spaghetti, noodles, refined rice.   Avoid:  Bread, rolls, or crackers made with whole wheat, multi-grains, rye, bran seeds, nuts, or coconut. Corn tortillas or taco shells. Cereals containing whole grains, multi-grains, bran, coconut, nuts, or raisins. Cooked or dry oatmeal. Coarse wheat cereals, granola. Cereals advertised as "high-fiber." Potato skins. Whole grain pasta, wild or brown rice. Popcorn. Sweet potatoes/yams. Sweet rolls, doughnuts, waffles, pancakes, sweet breads.  Vegetables  Allowed: Strained tomato and vegetable juices. Most well-cooked and canned vegetables without seeds. Fresh: Tender lettuce, cucumber without the skin, cabbage, spinach, bean  sprouts.   Avoid: Fresh, cooked, or canned: Artichokes, baked beans, beet greens, broccoli, Brussels sprouts, corn, kale, legumes, peas, sweet potatoes. Cooked: Green or red cabbage, spinach. Avoid large servings of any vegetables, because vegetables shrink when cooked, and they contain more fiber per serving than fresh vegetables.  Fruit  Allowed: All fruit juices except prune juice. Cooked or canned: Apricots, applesauce, cantaloupe, cherries, fruit cocktail, grapefruit, grapes, kiwi, mandarin oranges, peaches, pears, plums, watermelon. Fresh: Apples without skin, ripe banana, grapes, cantaloupe, cherries, grapefruit, peaches, oranges, plums. Keep servings limited to  cup or 1 piece.   Avoid: Fresh: Apple with skin, apricots, mango, pears, raspberries, strawberries. Prune juice, stewed or dried prunes. Dried fruits, raisins, dates. Large servings of all fresh fruits.  Meat and Meat Substitutes  Allowed: Ground or well-cooked tender beef, ham, veal, lamb, pork, or poultry. Eggs, plain cheese. Fish, oysters, shrimp, lobster, other seafoods. Liver, organ meats.   Avoid: Tough, fibrous meats with gristle. Peanut butter, smooth or chunky. Cheese, nuts, seeds, legumes, dried peas, beans, lentils.  Milk  Allowed: Yogurt, lactose-free milk, kefir, drinkable yogurt, buttermilk, soy milk.   Avoid: Milk, chocolate milk, beverages made with milk, such as milk shakes.  Soups  Allowed: Bouillon, broth, or soups made from allowed foods. Any strained soup.   Avoid: Soups made from vegetables that are not allowed, cream or milk-based soups.  Desserts and Sweets  Allowed: Sugar-free gelatin, sugar-free frozen ice pops made without sugar alcohol.   Avoid: Plain cakes and cookies, pie made with allowed fruit, pudding, custard, cream pie. Gelatin, fruit, ice, sherbet, frozen ice pops. Ice cream, ice milk without nuts. Plain hard candy,   honey, jelly, molasses, syrup, sugar, chocolate syrup, gumdrops,  marshmallows.  Fats and Oils  Allowed: Avoid any fats and oils.   Avoid: Seeds, nuts, olives, avocados. Margarine, butter, cream, mayonnaise, salad oils, plain salad dressings made from allowed foods. Plain gravy, crisp bacon without rind.  Beverages  Allowed: Water, decaffeinated teas, oral rehydration solutions, sugar-free beverages.   Avoid: Fruit juices, caffeinated beverages (coffee, tea, soda or pop), alcohol, sports drinks, or lemon-lime soda or pop.  Condiments  Allowed: Ketchup, mustard, horseradish, vinegar, cream sauce, cheese sauce, cocoa powder. Spices in moderation: allspice, basil, bay leaves, celery powder or leaves, cinnamon, cumin powder, curry powder, ginger, mace, marjoram, onion or garlic powder, oregano, paprika, parsley flakes, ground pepper, rosemary, sage, savory, tarragon, thyme, turmeric.   Avoid: Coconut, honey.  Weight Monitoring: Weigh yourself every day. You should weigh yourself in the morning after you urinate and before you eat breakfast. Wear the same amount of clothing when you weigh yourself. Record your weight daily. Bring your recorded weights to your clinic visits. Tell your caregiver right away if you have gained 3 lb/1.4 kg or more in 1 day, 5 lb/2.3 kg in a week, or whatever amount you were told to report. SEEK IMMEDIATE MEDICAL CARE IF:   You are unable to keep fluids down.   You start to throw up (vomit) or diarrhea keeps coming back (persistent).   Abdominal pain develops, increases, or can be felt in one place (localizes).   You have an oral temperature above 102 F (38.9 C), not controlled by medicine.   Diarrhea contains blood or mucus.   You develop excessive weakness, dizziness, fainting, or extreme thirst.  MAKE SURE YOU:   Understand these instructions.   Will watch your condition.   Will get help right away if you are not doing well or get worse.  Document Released: 06/25/2003 Document Revised: 03/24/2011 Document Reviewed:  10/16/2008 [employer] Patient Information 2012 [employer]. 

## 2011-08-17 NOTE — ED Notes (Signed)
Pt reports woke up with headache, n/v/d since 7am.  Reports abd pain around umbilicus area.

## 2011-08-17 NOTE — ED Notes (Signed)
Raynelle Fanning, PA in room with patient,

## 2011-08-17 NOTE — ED Notes (Signed)
Raynelle Fanning, PA in room talking with pt at present time, family updated,

## 2011-08-18 NOTE — ED Provider Notes (Signed)
Medical screening examination/treatment/procedure(s) were performed by non-physician practitioner and as supervising physician I was immediately available for consultation/collaboration.   Carleene Cooper III, MD 08/18/11 2021

## 2011-08-18 NOTE — ED Provider Notes (Signed)
History     CSN: [id number]  Arrival date & time 08/17/11  4098   First MD Initiated Contact with Patient 08/17/11 [id number]      Chief Complaint  Patient presents with  . Abdominal Pain  . Emesis  . Headache    (Consider location/radiation/quality/duration/timing/severity/associated sxs/prior treatment) HPI Comments: Dustin Rhodes presents with a 12 hour history of nausea,  Vomiting,  Diarrhea and intermittent periumbilical and lower abdominal cramping which improves after having a bowel movement.  Stools have been watery without mucous or blood,  reports 4 episodes of diarrhea before taking imodium at 7 am today.  He has had 3 episodes of bilious emesis without blood.  He also reports generalized headache, denies fever.  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  His girlfriend also reports having mid abdominal cramping but no v/d yet.    Patient is a 26 y.o. male presenting with vomiting and headaches. The history is provided by the patient.  Emesis  Associated symptoms include abdominal pain, diarrhea and headaches. Pertinent negatives include no arthralgias and no fever.  Headache  Associated symptoms include nausea and vomiting. Pertinent negatives include no fever and no shortness of breath.    Past Medical History  Diagnosis Date  . ADHD (attention deficit hyperactivity disorder)   . Acid reflux     Past Surgical History  Procedure Date  . Tympanoplasty   . Wisdom tooth extraction     Family History  Problem Relation Age of Onset  . Asthma Mother   . Migraines Mother   . Cancer Other   . Heart attack Other   . Alzheimer's disease Other     History  Substance Use Topics  . Smoking status: Current Everyday Smoker -- 1.0 packs/day for 10 years    Types: Cigarettes  . Smokeless tobacco: Former Neurosurgeon  . Alcohol Use: No      Review of Systems  Constitutional:  Negative for fever.  HENT: Negative for congestion, sore throat and neck pain.   Eyes: Negative.   Respiratory: Negative for chest tightness and shortness of breath.   Cardiovascular: Negative for chest pain.  Gastrointestinal: Positive for nausea, vomiting, abdominal pain and diarrhea.  Genitourinary: Negative.   Musculoskeletal: Negative for joint swelling and arthralgias.  Skin: Negative.  Negative for rash and wound.  Neurological: Positive for headaches. Negative for dizziness, weakness, light-headedness and numbness.  Hematological: Negative.   Psychiatric/Behavioral: Negative.     Allergies  Tramadol  Home Medications   Current Outpatient Rx  Name Route Sig Dispense Refill  . LOPERAMIDE HCL 2 MG PO CAPS Oral Take 4 mg by mouth 4 (four) times daily as needed. For diarrhea    . CLEAR EYES OP Both Eyes Place 1 drop into both eyes daily.     Marland Kitchen ONDANSETRON HCL 4 MG PO TABS Oral Take 1 tablet (4 mg total) by mouth every 8 (eight) hours as needed for nausea. 12 tablet 0    BP 140/80  Pulse 67  Temp(Src) 97.9 F (36.6 C) (Oral)  Resp 18  Ht 5\' 8"  (1.727 m)  Wt 225 lb (102.059 kg)  BMI 34.21 kg/m2  SpO2 100%  Physical Exam  Nursing note and vitals reviewed. Constitutional: He appears well-developed and well-nourished.  HENT:  Head: Normocephalic and atraumatic.  Eyes: Conjunctivae are normal.  Neck: Normal range of motion.  Cardiovascular: Normal rate, regular rhythm,  normal heart sounds and intact distal pulses.   Pulmonary/Chest: Effort normal and breath sounds normal. He has no wheezes.  Abdominal: Soft. Bowel sounds are normal. He exhibits no distension. There is no tenderness. There is no rebound and no guarding.       Increased bowel sounds llq.  No appreciable ttp,  No increased tympany.  Abdomen is benign.  Musculoskeletal: Normal range of motion.  Neurological: He is alert.  Skin: Skin is warm and dry.  Psychiatric: He has a normal mood and affect.    ED  Course  Procedures (including critical care time)  Labs Reviewed  COMPREHENSIVE METABOLIC PANEL - Abnormal; Notable for the following:    Glucose, Bld 109 (*)    All other components within normal limits  CBC  DIFFERENTIAL  URINALYSIS, ROUTINE W REFLEX MICROSCOPIC   No results found.   1. Gastroenteritis    Pt treated with toradol 30 mg IV - headache resolved.  1 L NS given ,  zofran - no emesis,  No further nausea in dept.  Pt reports had one stool while here which had more form than previous.   MDM  Labs reviewed and stable.  Pt re-examined and with no abdominal pain or discomfort.  Particularly no rlq pain suggestive of appendicitis,  Neg Murphy sign- doubt biliary source.  Viral gastroenteritis most probable diagnosis.  Pt prescribed zofran,  Continue imodium only if stools get loose,  Fluids,  Rest.  Recheck for worsening sx.        Burgess Amor, [place] 08/18/11 [phone]

## 2011-11-20 ENCOUNTER — Emergency Department (HOSPITAL_COMMUNITY)
Admission: EM | Admit: 2011-11-20 | Discharge: 2011-11-20 | Disposition: A | Discharge status: Home / Self Care | Payer: Self-pay | Attending: Emergency Medicine | Admitting: Emergency Medicine

## 2011-11-20 ENCOUNTER — Encounter (HOSPITAL_COMMUNITY): Payer: Self-pay | Admitting: Emergency Medicine

## 2011-11-20 DIAGNOSIS — F17200 Nicotine dependence, unspecified, uncomplicated: Secondary | ICD-10-CM | POA: Insufficient documentation

## 2011-11-20 DIAGNOSIS — F909 Attention-deficit hyperactivity disorder, unspecified type: Secondary | ICD-10-CM | POA: Insufficient documentation

## 2011-11-20 DIAGNOSIS — K219 Gastro-esophageal reflux disease without esophagitis: Secondary | ICD-10-CM | POA: Insufficient documentation

## 2011-11-20 DIAGNOSIS — B009 Herpesviral infection, unspecified: Secondary | ICD-10-CM | POA: Insufficient documentation

## 2011-11-20 MED ORDER — ACYCLOVIR 400 MG PO TABS: 400.0000 mg | ORAL_TABLET | Freq: Every day | ORAL | Status: AC

## 2011-11-20 MED ORDER — ACYCLOVIR 800 MG PO TABS
800.0000 mg | ORAL_TABLET | Freq: Once | ORAL | Status: AC
  Administered 2011-11-20: 800 mg via ORAL
  Filled 2011-11-20: qty 1

## 2011-11-20 MED ORDER — PREDNISONE 10 MG PO TABS: ORAL_TABLET | ORAL | Status: DC

## 2011-11-20 NOTE — ED Notes (Signed)
Working outside yesterday and noticed rash to L upper arm today. 3 small red spots noticed. Nad.

## 2011-11-20 NOTE — ED Notes (Signed)
States that he was doing yard work yesterday and noticed a rash with blisters on his right arm, states the area stings a little, states that the area does not itch at all.  States he does not remember being bitten or stung by anything, however was working in brush and [place].

## 2011-11-23 NOTE — ED Provider Notes (Signed)
Medical screening examination/treatment/procedure(s) were performed by non-physician practitioner and as supervising physician I was immediately available for consultation/collaboration.   Lyanne Co, MD 11/23/11 2141

## 2011-11-23 NOTE — ED Provider Notes (Signed)
History     CSN: [id number]  Arrival date & time 11/20/11  Dustin Rhodes   First MD Initiated Contact with Patient 11/20/11 1857      Chief Complaint  Patient presents with  . Rash    (Consider location/radiation/quality/duration/timing/severity/associated sxs/prior treatment) Patient is a 26 y.o. male presenting with rash. The history is provided by the patient.  Rash  This is a new problem. The current episode started 6 to 12 hours ago. The problem has been gradually worsening. The problem is associated with an unknown factor. There has been no fever. The rash is present on the left arm. The pain is mild. Associated symptoms include blisters and pain. Pertinent negatives include no itching and no weeping. Treatments tried: nothing. The treatment provided no relief.    Past Medical History  Diagnosis Date  . ADHD (attention deficit hyperactivity disorder)   . Acid reflux     Past Surgical History  Procedure Date  . Tympanoplasty   . Wisdom tooth extraction     Family History  Problem Relation Age of Onset  . Asthma Mother   . Migraines Mother   . Cancer Other   . Heart attack Other   . Alzheimer's disease Other     History  Substance Use Topics  . Smoking status: Current Everyday Smoker -- 1.0 packs/day for 10 years    Types: Cigarettes  . Smokeless tobacco: Former Neurosurgeon  . Alcohol Use: No      Review of Systems  Constitutional: Negative for fever, chills, activity change and appetite change.  HENT: Negative for sore throat, facial swelling, trouble swallowing, neck pain and neck stiffness.   Respiratory: Negative for chest tightness, shortness of breath and wheezing.   Skin: Positive for rash. Negative for itching and wound.  Neurological: Negative for dizziness, weakness, numbness and headaches.  Hematological: Negative for adenopathy.  All other systems reviewed and are negative.    Allergies  Tramadol  Home Medications   Current Outpatient Rx  Name Route  Sig Dispense Refill  . ACYCLOVIR 400 MG PO TABS Oral Take 1 tablet (400 mg total) by mouth 5 (five) times daily. 50 tablet 0  . PREDNISONE 10 MG PO TABS  Take 6 tablets day one, 5 tablets day two, 4 tablets day three, 3 tablets day four, 2 tablets day five, then 1 tablet day six 21 tablet 0    BP 144/81  Pulse 85  Temp 98.3 F (36.8 C) (Oral)  Resp 19  SpO2 99%  Physical Exam  Nursing note and vitals reviewed. Constitutional: He is oriented to person, place, and time. He appears well-developed and well-nourished. No distress.  HENT:  Head: Normocephalic and atraumatic.  Mouth/Throat: Oropharynx is clear and moist.  Neck: Normal range of motion. Neck supple.  Cardiovascular: Normal rate, regular rhythm and normal heart sounds.   Pulmonary/Chest: Effort normal and breath sounds normal.  Musculoskeletal: He exhibits tenderness. He exhibits no edema.       See skin exam  Lymphadenopathy:    He has no cervical adenopathy.  Neurological: He is alert and oriented to person, place, and time. He exhibits normal muscle tone. Coordination normal.  Skin: Rash noted. There is erythema.       Several patches of grouped vesicles to the left forearm.  Distal sensation intact, ROM of the wrist and elbow nml, radial pulse brisk.  CR< 2 sec.  No edema    ED Course  Procedures (including critical care time)  Labs Reviewed -  No data to display   1. 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  No known insect bite, no other rash present.  Pt non-toxic appearing.  Agrees to return here if sx's worsen.  The patient appears reasonably screened and/or stabilized for discharge and I doubt any other medical condition or other [facility] requiring further screening, evaluation, or treatment in the ED at this time prior to discharge.       Asharia Lotter L. Hervey Wedig, [place] 11/23/11 1719

## 2011-11-30 ENCOUNTER — Emergency Department (HOSPITAL_COMMUNITY): Payer: Self-pay

## 2011-11-30 ENCOUNTER — Emergency Department (HOSPITAL_COMMUNITY)
Admission: EM | Admit: 2011-11-30 | Discharge: 2011-11-30 | Disposition: A | Discharge status: Home / Self Care | Payer: Self-pay | Attending: Emergency Medicine | Admitting: Emergency Medicine

## 2011-11-30 ENCOUNTER — Other Ambulatory Visit: Payer: Self-pay

## 2011-11-30 ENCOUNTER — Encounter (HOSPITAL_COMMUNITY): Payer: Self-pay | Admitting: *Deleted

## 2011-11-30 DIAGNOSIS — F17200 Nicotine dependence, unspecified, uncomplicated: Secondary | ICD-10-CM | POA: Insufficient documentation

## 2011-11-30 DIAGNOSIS — F909 Attention-deficit hyperactivity disorder, unspecified type: Secondary | ICD-10-CM | POA: Insufficient documentation

## 2011-11-30 DIAGNOSIS — K219 Gastro-esophageal reflux disease without esophagitis: Secondary | ICD-10-CM | POA: Insufficient documentation

## 2011-11-30 DIAGNOSIS — R0789 Other chest pain: Secondary | ICD-10-CM

## 2011-11-30 DIAGNOSIS — R071 Chest pain on breathing: Secondary | ICD-10-CM | POA: Insufficient documentation

## 2011-11-30 LAB — CBC WITH DIFFERENTIAL/PLATELET
Eosinophils Absolute: 0.3 10*3/uL (ref 0.0–0.7)
Eosinophils Relative: 3 % (ref 0–5)
Hemoglobin: 14.3 g/dL (ref 13.0–17.0)
Lymphocytes Relative: 35 % (ref 12–46)
Lymphs Abs: 3.7 10*3/uL (ref 0.7–4.0)
MCH: 30.6 pg (ref 26.0–34.0)
MCV: 86.9 fL (ref 78.0–100.0)
Monocytes Relative: 7 % (ref 3–12)
Neutrophils Relative %: 55 % (ref 43–77)
Platelets: 238 10*3/uL (ref 150–400)
RBC: 4.67 MIL/uL (ref 4.22–5.81)
WBC: 10.6 10*3/uL — ABNORMAL HIGH (ref 4.0–10.5)

## 2011-11-30 LAB — COMPREHENSIVE METABOLIC PANEL
ALT: 25 U/L (ref 0–53)
Alkaline Phosphatase: 46 U/L (ref 39–117)
BUN: 13 mg/dL (ref 6–23)
CO2: 25 mEq/L (ref 19–32)
GFR calc Af Amer: >90 mL/min (ref >90)
GFR calc non Af Amer: >90 mL/min (ref >90)
Glucose, Bld: 101 mg/dL — ABNORMAL HIGH (ref 70–99)
Potassium: 3.9 mEq/L (ref 3.5–5.1)
Sodium: 139 mEq/L (ref 135–145)
Total Protein: 6.9 g/dL (ref 6.0–8.3)

## 2011-11-30 LAB — TROPONIN I: Troponin I: <0.3 ng/mL (ref <0.30)

## 2011-11-30 MED ORDER — CYCLOBENZAPRINE HCL 5 MG PO TABS: 5.0000 mg | ORAL_TABLET | Freq: Three times a day (TID) | ORAL | Status: AC | PRN

## 2011-11-30 MED ORDER — CYCLOBENZAPRINE HCL 10 MG PO TABS
10.0000 mg | ORAL_TABLET | Freq: Three times a day (TID) | ORAL | Status: DC | PRN
  Administered 2011-11-30: 10 mg via ORAL
  Filled 2011-11-30: qty 1

## 2011-11-30 MED ORDER — KETOROLAC TROMETHAMINE 30 MG/ML IJ SOLN
30.0000 mg | Freq: Once | INTRAMUSCULAR | Status: AC
  Administered 2011-11-30: 30 mg via INTRAVENOUS
  Filled 2011-11-30: qty 1

## 2011-11-30 NOTE — ED Notes (Signed)
Pain lt chest , with sob, and radiation down lt arm , onset 1130 pm.

## 2011-11-30 NOTE — ED Provider Notes (Signed)
History    This chart was scribed for Ward Givens, MD, MD by Smitty Pluck. The patient was seen in room APA08 and the patient's care was started at 7:17PM.   CSN: [id number]  Arrival date & time 11/30/11  1621   First MD Initiated Contact with Patient 11/30/11 1834      Chief Complaint  Patient presents with  . Chest Pain    (Consider location/radiation/quality/duration/timing/severity/associated sxs/prior treatment) The history is provided by the patient.   Dustin Rhodes is a 26 y.o. male who presents to the Emergency Department complaining of moderate, sharp central  chest pain radiating to left arm with numbness in left arm. 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 Pt reports that pain lasted for 1 hour then it eased but the pain was still present. Pt reports having SOB and dizziness. Pt reports that he felt someone was poking him in the ribs with a knife. Denies any aggravating and relieving factors. Pt reports that he has had similar symptoms in the past 2 weeks ago (pt was in bed when incident occurred).He denies nausea, vomiting, cough, but states he does feel exhausted.    Pt smokes 1 pack of cigarettes/day.    PCP PCP [facility] Department  Past Medical History  Diagnosis Date  . ADHD (attention deficit hyperactivity disorder)   . Acid reflux     Past Surgical History  Procedure Date  . Tympanoplasty   . Wisdom tooth extraction     Family History  Problem Relation Age of Onset  . Asthma Mother   . Migraines Mother   . Cancer Other   . Heart attack Other   . Alzheimer's disease Other   Pt reports his maternal grandmother had MI in late 80s and aunt had MI when she was 47.  History  Substance Use Topics  . Smoking status: Current Everyday Smoker -- 1.0 packs/day for 10 years    Types: Cigarettes  . Smokeless tobacco: Former Neurosurgeon  . Alcohol Use: No  employed    Review of Systems  All other systems  reviewed and are negative.  10 Systems reviewed and all are negative for acute change except as noted in the HPI.    Allergies  Tramadol  Home Medications   Current Outpatient Rx  Name Route Sig Dispense Refill  . ACYCLOVIR 400 MG PO TABS Oral Take 1 tablet (400 mg total) by mouth 5 (five) times daily. 50 tablet 0  . PREDNISONE 10 MG PO TABS  Take 6 tablets day one, 5 tablets day two, 4 tablets day three, 3 tablets day four, 2 tablets day five, then 1 tablet day six 21 tablet 0    BP 143/90  Pulse 89  Temp 98.3 F (36.8 C) (Oral)  Resp 18  Ht 5\' 8"  (1.727 m)  Wt 231 lb (104.781 kg)  BMI 35.12 kg/m2  SpO2 97%  Vital signs normal    Physical Exam  Nursing note and vitals reviewed. Constitutional: He is oriented to person, place, and time. He appears well-developed and well-nourished.  Non-toxic appearance. He does not appear ill. No distress.  HENT:  Head: Normocephalic and atraumatic.  Right Ear: External ear normal.  Left Ear: External ear normal.  Nose: Nose normal. No mucosal edema or rhinorrhea.  Mouth/Throat: Oropharynx is clear and moist and mucous membranes are normal. No dental abscesses or uvula swelling.       Dry tongue  Eyes: Conjunctivae and EOM are normal. Pupils are equal, round, and reactive to light.  Neck: Normal range of motion and full passive range of motion without pain. Neck supple.  Cardiovascular: Normal rate, regular rhythm and normal heart sounds.  Exam reveals no gallop and no friction rub.   No murmur heard. Pulmonary/Chest: Effort normal and breath sounds normal. No respiratory distress. He has no wheezes. He has no rhonchi. He has no rales. He exhibits tenderness (left lateral chest in anterior axilla at 6th rib level that is reproducible ). He exhibits no crepitus.  Abdominal: Soft. Normal appearance and bowel sounds are normal. He exhibits no distension. There is no tenderness. There is no rebound and no guarding.  Musculoskeletal:  Normal range of motion. He exhibits no edema and no tenderness.       Moves all extremities well.   Neurological: He is alert and oriented to person, place, and time. He has normal strength. No cranial nerve deficit.  Skin: Skin is warm, dry and intact. No rash noted. No erythema. No pallor.  Psychiatric: He has a normal mood and affect. His speech is normal and behavior is normal. His mood appears not anxious.    ED Course  Procedures (including critical care time)   Medications  cyclobenzaprine (FLEXERIL) tablet 10 mg (10 mg Oral Given 11/30/11 1940)  cyclobenzaprine (FLEXERIL) 5 MG tablet (not administered)  ketorolac (TORADOL) 30 MG/ML injection 30 mg (30 mg Intravenous Given 11/30/11 1942)    DIAGNOSTIC STUDIES: Oxygen Saturation is 97% on room air, normal by my interpretation.    COORDINATION OF CARE: 7:24PM EDP discusses pt ED treatment with pt   9:10PM Recheck: EDP discussed lab results and treatment course with pt. Pt is feeling better. Pt is ready for discharge.   Results for orders placed during the hospital encounter of 11/30/11  CBC WITH DIFFERENTIAL      Component Value Range   WBC 10.6 (*) 4.0 - 10.5 K/uL   RBC 4.67  4.22 - 5.81 MIL/uL   Hemoglobin 14.3  13.0 - 17.0 g/dL   HCT 40.9  81.1 - 91.4 %   MCV 86.9  78.0 - 100.0 fL   MCH 30.6  26.0 - 34.0 pg   MCHC 35.2  30.0 - 36.0 g/dL   RDW 78.2  95.6 - 21.3 %   Platelets 238  150 - 400 K/uL   Neutrophils Relative 55  43 - 77 %   Neutro Abs 5.8  1.7 - 7.7 K/uL   Lymphocytes Relative 35  12 - 46 %   Lymphs Abs 3.7  0.7 - 4.0 K/uL   Monocytes Relative 7  3 - 12 %   Monocytes Absolute 0.8  0.1 - 1.0 K/uL   Eosinophils Relative 3  0 - 5 %   Eosinophils Absolute 0.3  0.0 - 0.7 K/uL   Basophils Relative 0  0 - 1 %   Basophils Absolute 0.0  0.0 - 0.1 K/uL  COMPREHENSIVE METABOLIC PANEL      Component Value Range   Sodium 139  135 - 145 mEq/L   Potassium 3.9  3.5 - 5.1 mEq/L   Chloride 105  96 - 112 mEq/L   CO2 25   19 - 32 mEq/L   Glucose, Bld 101 (*) 70 - 99 mg/dL   BUN 13  6 - 23 mg/dL   Creatinine, Ser 0.86  0.50 - 1.35 mg/dL   Calcium 9.3  8.4 - 57.8 mg/dL   Total Protein  6.9  6.0 - 8.3 g/dL   Albumin 3.7  3.5 - 5.2 g/dL   AST 17  0 - 37 U/L   ALT 25  0 - 53 U/L   Alkaline Phosphatase 46  39 - 117 U/L   Total Bilirubin 0.5  0.3 - 1.2 mg/dL   GFR calc non Af Amer >90  >90 mL/min   GFR calc Af Amer >90  >90 mL/min  TROPONIN I      Component Value Range   Troponin I <0.30  <0.30 ng/mL    Laboratory interpretation all normal   Dg Chest 2 View  11/30/2011  *RADIOLOGY REPORT*  Clinical Data: Chest pain  CHEST - 2 VIEW  Comparison: None.  Findings: No pneumothorax.  Lungs clear.  Heart size and pulmonary vascularity normal.  No effusion.  Visualized bones unremarkable.  IMPRESSION: No acute disease  Original Report Authenticated By: Osa Craver, M.D.    Date: 11/30/2011  Rate: 75  Rhythm: normal sinus rhythm and sinus arrhythmia  QRS Axis: normal  Intervals: normal  ST/T Wave abnormalities: normal  Conduction Disutrbances:none  Narrative Interpretation:   Old EKG Reviewed: none available    1. Chest wall pain     New Prescriptions   CYCLOBENZAPRINE (FLEXERIL) 5 MG TABLET    Take 1 tablet (5 mg total) by mouth 3 (three) times daily as needed for muscle spasms.  ibuprofen 600 mg 4 times a day  Plan discharge  Devoria Albe, MD, FACEP   MDM   I personally performed the services described in this documentation, which was scribed in my presence. The recorded information has been reviewed and considered.  Devoria Albe, MD, FACEP        Ward Givens, MD 12/01/11 [phone]

## 2011-11-30 NOTE — ED Notes (Signed)
Pt alert & oriented x4, stable gait. Patient given discharge instructions, paperwork & prescription(s). Patient  instructed to stop at the registration desk to finish any additional paperwork. Patient verbalized understanding. Pt left department w/ no further questions. 

## 2011-11-30 NOTE — ED Notes (Signed)
Pt reports left chest pain that started around 1030 this morning while lift a at work.

## 2012-01-19 ENCOUNTER — Encounter (HOSPITAL_COMMUNITY): Payer: Self-pay | Admitting: *Deleted

## 2012-01-19 ENCOUNTER — Emergency Department (HOSPITAL_COMMUNITY)
Admission: EM | Admit: 2012-01-19 | Discharge: 2012-01-19 | Disposition: A | Discharge status: Home / Self Care | Payer: Self-pay | Attending: Emergency Medicine | Admitting: Emergency Medicine

## 2012-01-19 ENCOUNTER — Emergency Department (HOSPITAL_COMMUNITY): Payer: Self-pay

## 2012-01-19 DIAGNOSIS — J029 Acute pharyngitis, unspecified: Secondary | ICD-10-CM | POA: Insufficient documentation

## 2012-01-19 DIAGNOSIS — Z8489 Family history of other specified conditions: Secondary | ICD-10-CM | POA: Insufficient documentation

## 2012-01-19 DIAGNOSIS — Z825 Family history of asthma and other chronic lower respiratory diseases: Secondary | ICD-10-CM | POA: Insufficient documentation

## 2012-01-19 DIAGNOSIS — K219 Gastro-esophageal reflux disease without esophagitis: Secondary | ICD-10-CM | POA: Insufficient documentation

## 2012-01-19 DIAGNOSIS — Z8249 Family history of ischemic heart disease and other diseases of the circulatory system: Secondary | ICD-10-CM | POA: Insufficient documentation

## 2012-01-19 DIAGNOSIS — R059 Cough, unspecified: Secondary | ICD-10-CM | POA: Insufficient documentation

## 2012-01-19 DIAGNOSIS — Z809 Family history of malignant neoplasm, unspecified: Secondary | ICD-10-CM | POA: Insufficient documentation

## 2012-01-19 DIAGNOSIS — F909 Attention-deficit hyperactivity disorder, unspecified type: Secondary | ICD-10-CM | POA: Insufficient documentation

## 2012-01-19 DIAGNOSIS — R05 Cough: Secondary | ICD-10-CM

## 2012-01-19 DIAGNOSIS — Z888 Allergy status to other drugs, medicaments and biological substances status: Secondary | ICD-10-CM | POA: Insufficient documentation

## 2012-01-19 DIAGNOSIS — F17200 Nicotine dependence, unspecified, uncomplicated: Secondary | ICD-10-CM | POA: Insufficient documentation

## 2012-01-19 MED ORDER — GUAIFENESIN-CODEINE 100-10 MG/5ML PO SYRP: 5.0000 mL | ORAL_SOLUTION | Freq: Three times a day (TID) | ORAL | Status: DC | PRN

## 2012-01-19 MED ORDER — IBUPROFEN 800 MG PO TABS
800.0000 mg | ORAL_TABLET | Freq: Once | ORAL | Status: AC
  Administered 2012-01-19: 800 mg via ORAL
  Filled 2012-01-19: qty 1

## 2012-01-19 NOTE — ED Notes (Signed)
Sore throat,  Had diarrhea , vomiting, , which stopped 2 days ago.  Now sore throat, cough and headache.

## 2012-01-19 NOTE — ED Provider Notes (Signed)
History     CSN: [id number]  Arrival date & time 01/19/12  1606   First MD Initiated Contact with Patient 01/19/12 1642      Chief Complaint  Patient presents with  . Sore Throat    (Consider location/radiation/quality/duration/timing/severity/associated sxs/prior treatment) Patient is a 26 y.o. male presenting with pharyngitis. The history is provided by the patient. No language interpreter was used.  Sore Throat This is a new problem. The problem occurs 2 to 4 times per day. The problem has been unchanged. Associated symptoms include coughing, headaches and a sore throat. Pertinent negatives include no chest pain, fever, nausea, neck pain, rash, swollen glands or vomiting.    Past Medical History  Diagnosis Date  . ADHD (attention deficit hyperactivity disorder)   . Acid reflux     Past Surgical History  Procedure Date  . Tympanoplasty   . Wisdom tooth extraction     Family History  Problem Relation Age of Onset  . Asthma Mother   . Migraines Mother   . Cancer Other   . Heart attack Other   . Alzheimer's disease Other     History  Substance Use Topics  . Smoking status: Current Every Day Smoker -- 1.0 packs/day for 10 years    Types: Cigarettes  . Smokeless tobacco: Former Neurosurgeon  . Alcohol Use: No      Review of Systems  Constitutional: Negative for fever.  HENT: Positive for sore throat. Negative for neck pain.   Respiratory: Positive for cough.   Cardiovascular: Negative for chest pain.  Gastrointestinal: Negative for nausea and vomiting.  Skin: Negative for rash.  Neurological: Positive for headaches.  All other systems reviewed and are negative.    Allergies  Tramadol  Home Medications   Current Outpatient Rx  Name Route Sig Dispense Refill  . ALLERGY/SINUS APAP PO Oral Take 2 tablets by mouth once as needed. For relief    . NAPROXEN SODIUM 220 MG PO TABS Oral Take 440 mg by mouth once as needed. For  pain    . GUAIFENESIN-CODEINE 100-10  MG/5ML PO SYRP Oral Take 5 mLs by mouth 3 (three) times daily as needed for cough. 120 mL 0    BP 128/80  Pulse 80  Temp 98 F (36.7 C) (Oral)  Resp 20  Ht 5\' 8"  (1.727 m)  Wt 245 lb (111.131 kg)  BMI 37.25 kg/m2  SpO2 99%  Physical Exam  Nursing note and vitals reviewed. Constitutional: He is oriented to person, place, and time. He appears well-developed and well-nourished.  HENT:  Head: Normocephalic and atraumatic.  Mouth/Throat: Uvula is midline and mucous membranes are normal. No uvula swelling. Posterior oropharyngeal erythema present. No oropharyngeal exudate, posterior oropharyngeal edema or tonsillar abscesses.  Eyes: EOM are normal.  Neck: Normal range of motion.  Cardiovascular: Normal rate, regular rhythm, normal heart sounds and intact distal pulses.   Pulmonary/Chest: Effort normal. No respiratory distress. He has no decreased breath sounds. He has wheezes in the right lower field. He has no rhonchi. He has no rales.  Abdominal: Soft. He exhibits no distension. There is no tenderness.  Musculoskeletal: Normal range of motion.  Neurological: He is alert and oriented to person, place, and time. He has normal strength. No sensory deficit. Coordination and gait normal. GCS eye subscore is 4. GCS verbal subscore is 5. GCS motor subscore is 6.       Diffuse headache  Skin: Skin is warm and dry.  Psychiatric: He has a normal  mood and affect. Judgment normal.    ED Course  Procedures (including critical care time)   Labs Reviewed  RAPID STREP SCREEN   Dg Chest 2 View  01/19/2012  *RADIOLOGY REPORT*  Clinical Data: Cough, sore throat, headache  CHEST - 2 VIEW  Comparison: 11/30/2011.  Findings: Normal cardiac silhouette and mediastinal contours.  No focal parenchymal opacity.  No pleural effusion or pneumothorax. Unchanged bones.  IMPRESSION: No acute cardiopulmonary disease.  Specifically, no evidence of pneumonia.   Original Report Authenticated By: Waynard Reeds, M.D.       1. Cough   2. Pharyngitis       MDM  no PNA   Strep screen negative   rx-robitussin AC Tylenol or ibuprofen, salt water gargles, chloraseptic prn Find PCP        Evalina Field, PA 01/19/12 1749

## 2012-01-19 NOTE — ED Notes (Signed)
Patient with no complaints at this time. Respirations even and unlabored. Skin warm/dry. Discharge instructions reviewed with patient at this time. Patient given opportunity to voice concerns/ask questions. Patient discharged at this time and left Emergency Department with steady gait.   

## 2012-01-20 NOTE — ED Provider Notes (Signed)
Medical screening examination/treatment/procedure(s) were performed by non-physician practitioner and as supervising physician I was immediately available for consultation/collaboration.   Marquin Patino M Esperanza Madrazo, MD 01/20/12 1759 

## 2012-01-22 ENCOUNTER — Emergency Department (HOSPITAL_COMMUNITY): Payer: Self-pay

## 2012-01-22 ENCOUNTER — Emergency Department (HOSPITAL_COMMUNITY)
Admission: EM | Admit: 2012-01-22 | Discharge: 2012-01-22 | Disposition: A | Discharge status: Home / Self Care | Payer: Self-pay | Attending: Emergency Medicine | Admitting: Emergency Medicine

## 2012-01-22 ENCOUNTER — Encounter (HOSPITAL_COMMUNITY): Payer: Self-pay

## 2012-01-22 DIAGNOSIS — Y998 Other external cause status: Secondary | ICD-10-CM | POA: Insufficient documentation

## 2012-01-22 DIAGNOSIS — X58XXXA Exposure to other specified factors, initial encounter: Secondary | ICD-10-CM | POA: Insufficient documentation

## 2012-01-22 DIAGNOSIS — S298XXA Other specified injuries of thorax, initial encounter: Secondary | ICD-10-CM | POA: Insufficient documentation

## 2012-01-22 DIAGNOSIS — F17200 Nicotine dependence, unspecified, uncomplicated: Secondary | ICD-10-CM | POA: Insufficient documentation

## 2012-01-22 DIAGNOSIS — F909 Attention-deficit hyperactivity disorder, unspecified type: Secondary | ICD-10-CM | POA: Insufficient documentation

## 2012-01-22 DIAGNOSIS — Y9389 Activity, other specified: Secondary | ICD-10-CM | POA: Insufficient documentation

## 2012-01-22 DIAGNOSIS — R0789 Other chest pain: Secondary | ICD-10-CM

## 2012-01-22 DIAGNOSIS — K219 Gastro-esophageal reflux disease without esophagitis: Secondary | ICD-10-CM | POA: Insufficient documentation

## 2012-01-22 MED ORDER — ALBUTEROL SULFATE HFA 108 (90 BASE) MCG/ACT IN AERS
2.0000 | INHALATION_SPRAY | Freq: Once | RESPIRATORY_TRACT | Status: AC
  Administered 2012-01-22: 2 via RESPIRATORY_TRACT
  Filled 2012-01-22: qty 6.7

## 2012-01-22 MED ORDER — HYDROCODONE-ACETAMINOPHEN 5-325 MG PO TABS: ORAL_TABLET | ORAL | Status: DC

## 2012-01-22 MED ORDER — HYDROCODONE-ACETAMINOPHEN 5-325 MG PO TABS
1.0000 | ORAL_TABLET | Freq: Once | ORAL | Status: AC
  Administered 2012-01-22: 1 via ORAL
  Filled 2012-01-22: qty 1

## 2012-01-22 NOTE — ED Provider Notes (Signed)
History     CSN: [id number]  Arrival date & time 01/22/12  1441   First MD Initiated Contact with Patient 01/22/12 1613      Chief Complaint  Patient presents with  . Rib Injury    (Consider location/radiation/quality/duration/timing/severity/associated sxs/prior treatment) HPI Comments: Patient c/o sudden sharp pain to his left chest wall that occurred after a forceful cough.  He was seen here and treated for the cough several days ago.  Pain to his chest is worse with certain movements, coughing, sneezing or laughing.  Improves when he remains still.  He denies shortness of breath, vomiting or abdominal pain.  Patient is a 26 y.o. male presenting with cough. The history is provided by the patient.  Cough This is a new problem. The problem occurs constantly. The problem has not changed since onset.The cough is non-productive. There has been no fever. Associated symptoms include chest pain and wheezing. Pertinent negatives include no chills, no sweats, no ear congestion, no ear pain, no headaches, no rhinorrhea, no sore throat, no myalgias and no shortness of breath. He has tried cough syrup for the symptoms. The treatment provided no relief. He is a smoker. His past medical history does not include bronchitis, pneumonia, COPD or asthma.    Past Medical History  Diagnosis Date  . ADHD (attention deficit hyperactivity disorder)   . Acid reflux     Past Surgical History  Procedure Date  . Tympanoplasty   . Wisdom tooth extraction     Family History  Problem Relation Age of Onset  . Asthma Mother   . Migraines Mother   . Cancer Other   . Heart attack Other   . Alzheimer's disease Other     History  Substance Use Topics  . Smoking status: Current Every Day Smoker -- 1.0 packs/day for 10 years    Types: Cigarettes  . Smokeless tobacco: Former Neurosurgeon  . Alcohol Use: No      Review of Systems  Constitutional: Negative for fever, chills, activity change and appetite  change.  HENT: Positive for congestion. Negative for ear pain, sore throat, facial swelling, rhinorrhea, trouble swallowing, neck pain and neck stiffness.   Eyes: Negative for visual disturbance.  Respiratory: Positive for cough and wheezing. Negative for chest tightness, shortness of breath and stridor.   Cardiovascular: Positive for chest pain. Negative for palpitations.  Gastrointestinal: Negative for nausea, vomiting and abdominal pain.  Genitourinary: Negative for flank pain and difficulty urinating.  Musculoskeletal: Negative for myalgias.  Skin: Negative.   Neurological: Negative for dizziness, weakness, numbness and headaches.  Hematological: Negative for adenopathy.  Psychiatric/Behavioral: Negative for confusion.  All other systems reviewed and are negative.    Allergies  Tramadol  Home Medications   Current Outpatient Rx  Name Route Sig Dispense Refill  . ALLERGY/SINUS APAP PO Oral Take 2 tablets by mouth once as needed. For relief    . GUAIFENESIN-CODEINE 100-10 MG/5ML PO SYRP Oral Take 5 mLs by mouth 3 (three) times daily as needed for cough. 120 mL 0  . NAPROXEN SODIUM 220 MG PO TABS Oral Take 440 mg by mouth once as needed. For  pain      BP 161/99  Pulse 74  Temp 98 F (36.7 C) (Oral)  Resp 20  Ht 5\' 8"  (1.727 m)  Wt 240 lb (108.863 kg)  BMI 36.49 kg/m2  SpO2 97%  Physical Exam  Nursing note and vitals reviewed. Constitutional: He is oriented to person, place, and time. He appears  well-developed and well-nourished. No distress.  HENT:  Head: Normocephalic and atraumatic.  Right Ear: Tympanic membrane and ear canal normal.  Left Ear: Tympanic membrane and ear canal normal.  Mouth/Throat: Uvula is midline, oropharynx is clear and moist and mucous membranes are normal. No oropharyngeal exudate.  Eyes: EOM are normal. Pupils are equal, round, and reactive to light.  Neck: Normal range of motion. Neck supple.  Cardiovascular: Normal rate, regular rhythm,  normal heart sounds and intact distal pulses.   No murmur heard. Pulmonary/Chest: Effort normal and breath sounds normal. No respiratory distress. He has no decreased breath sounds. He has no wheezes. He has no rales. Chest wall is not dull to percussion. He exhibits tenderness. He exhibits no mass, no laceration, no crepitus, no edema, no deformity, no swelling and no retraction.         Lungs are CTA bilaterally.  Localized ttp of the lower, lateral left chest wall.  No edema, bruising or crepitus.  Abdominal: Soft. He exhibits no distension. There is no tenderness.  Musculoskeletal: He exhibits no edema.  Lymphadenopathy:    He has no cervical adenopathy.  Neurological: He is alert and oriented to person, place, and time. He exhibits normal muscle tone. Coordination normal.  Skin: Skin is warm and dry.    ED Course  Procedures (including critical care time)  Labs Reviewed - No data to display Dg Chest 2 View  01/22/2012  *RADIOLOGY REPORT*  Clinical Data: Rib injury.  Left-sided chest pain, cough, congestion.  CHEST - 2 VIEW  Comparison: 01/19/2012  Findings: Cardiomediastinal silhouette is within normal limits. The lungs are free of focal consolidations and pleural effusions. No evidence for pneumothorax.  No displaced, acute rib fracture identified.  IMPRESSION: Negative exam.   Original Report Authenticated By: Patterson Hammersmith, M.D.         MDM     Previous ED chart reviewed by me.  Pt has localized ttp of the left chest wall.  No guarding or crepitus.  No obvious rib fx on x-ray.  Pt agrees to f/u with his PMD or to return if sx's worsen.  Advised to take deep breaths and cough several times per day.    The patient appears reasonably screened and/or stabilized for discharge and I doubt any other medical condition or other [facility] requiring further screening, evaluation, or treatment in the ED at this time prior to discharge.   Prescribed: norco #15     Takeyah Wieman L. Sidra Oldfield,  [place] 01/23/12 1315

## 2012-01-22 NOTE — ED Notes (Signed)
Pt reports being sick with cough/congestion.  This am was coughing and felt pop on left side. Thinks he may have broken a rib.  Hurts to take a deep breath.

## 2012-01-23 NOTE — ED Provider Notes (Signed)
Medical screening examination/treatment/procedure(s) were performed by non-physician practitioner and as supervising physician I was immediately available for consultation/collaboration.   Glynn Octave, MD 01/23/12 1321

## 2012-02-13 ENCOUNTER — Emergency Department (HOSPITAL_COMMUNITY)
Admission: EM | Admit: 2012-02-13 | Discharge: 2012-02-13 | Disposition: A | Discharge status: Home / Self Care | Payer: Self-pay | Attending: Emergency Medicine | Admitting: Emergency Medicine

## 2012-02-13 ENCOUNTER — Encounter (HOSPITAL_COMMUNITY): Payer: Self-pay

## 2012-02-13 DIAGNOSIS — R059 Cough, unspecified: Secondary | ICD-10-CM | POA: Insufficient documentation

## 2012-02-13 DIAGNOSIS — R05 Cough: Secondary | ICD-10-CM | POA: Insufficient documentation

## 2012-02-13 DIAGNOSIS — R079 Chest pain, unspecified: Secondary | ICD-10-CM | POA: Insufficient documentation

## 2012-02-13 DIAGNOSIS — Z79899 Other long term (current) drug therapy: Secondary | ICD-10-CM | POA: Insufficient documentation

## 2012-02-13 DIAGNOSIS — F17200 Nicotine dependence, unspecified, uncomplicated: Secondary | ICD-10-CM | POA: Insufficient documentation

## 2012-02-13 DIAGNOSIS — F909 Attention-deficit hyperactivity disorder, unspecified type: Secondary | ICD-10-CM | POA: Insufficient documentation

## 2012-02-13 DIAGNOSIS — Z8719 Personal history of other diseases of the digestive system: Secondary | ICD-10-CM | POA: Insufficient documentation

## 2012-02-13 DIAGNOSIS — R0781 Pleurodynia: Secondary | ICD-10-CM

## 2012-02-13 MED ORDER — NAPROXEN 500 MG PO TABS: 500.0000 mg | ORAL_TABLET | Freq: Two times a day (BID) | ORAL | Status: DC

## 2012-02-13 MED ORDER — HYDROCODONE-ACETAMINOPHEN 5-325 MG PO TABS: 1.0000 | ORAL_TABLET | Freq: Four times a day (QID) | ORAL | Status: DC | PRN

## 2012-02-13 NOTE — ED Notes (Signed)
Pt reports coughed 3 weeks ago and c/o pain in left ribs since.

## 2012-02-13 NOTE — ED Notes (Signed)
Pt states left rib pain started 3 weeks prior to yesterday. Pt reports coughing "really hard" and pain began. Pt states was seen here 3 weeks ago, has not received relief thus far. Pt reports pain increases with deep inspiratory breath. BBS clear and equal. Denies SOB at this time. NAD noted.

## 2012-02-13 NOTE — ED Provider Notes (Signed)
History   This chart was scribed for [name], MD by Gerlean Ren. This patient was seen in room APA10/APA10 and the patient's care was started at 11:14.   CSN: [id number]  Arrival date & time 02/13/12  1037   First MD Initiated Contact with Patient 02/13/12 1052      Chief Complaint  Patient presents with  . left rib pain     (Consider location/radiation/quality/duration/timing/severity/associated sxs/prior treatment) The history is provided by the patient. No language interpreter was used.   Dustin Rhodes is a 26 y.o. male who presents to the Emergency Department complaining of pain over left lateral ribs between ribs 6-8 secondary to coughing 3 weeks ago.  Pt reports pain was severe this morning but has gradually improved.  Pt was seen here 10/06 for same complaint and XRs were negative.  Since then, pt reports pain has been elicited by irregular coughs.  Pt denies chest pain, back pain, abdominal pain,and dyspnea.  Pt has no h/o chronic medical conditions.  Pt is a current everyday smoker but denies alcohol use.   Past Medical History  Diagnosis Date  . ADHD (attention deficit hyperactivity disorder)   . Acid reflux     Past Surgical History  Procedure Date  . Tympanoplasty   . Wisdom tooth extraction     Family History  Problem Relation Age of Onset  . Asthma Mother   . Migraines Mother   . Cancer Other   . Heart attack Other   . Alzheimer's disease Other     History  Substance Use Topics  . Smoking status: Current Every Day Smoker -- 1.0 packs/day for 10 years    Types: Cigarettes  . Smokeless tobacco: Former Neurosurgeon  . Alcohol Use: No      Review of Systems  Constitutional: Negative for fever.  HENT: Negative for neck pain.   Eyes: Negative for visual disturbance.  Respiratory: Negative for shortness of breath.   Cardiovascular: Negative for chest pain.  Gastrointestinal: Negative for abdominal pain.  Genitourinary: Negative for dysuria.    Skin: Negative for rash.  Neurological: Negative for headaches.  Psychiatric/Behavioral: Negative for confusion.    Allergies  Goodys body pain and Tramadol  Home Medications   Current Outpatient Rx  Name Route Sig Dispense Refill  . GOODYS BODY PAIN PO Oral Take 1 packet by mouth every 4 (four) hours as needed. Pain    . HYDROCODONE-ACETAMINOPHEN 5-325 MG PO TABS Oral Take 1-2 tablets by mouth every 6 (six) hours as needed for pain. 10 tablet 0  . NAPROXEN 500 MG PO TABS Oral Take 1 tablet (500 mg total) by mouth 2 (two) times daily. 14 tablet 0    BP 136/81  Pulse 71  Temp 97.6 F (36.4 C) (Oral)  Resp 18  Ht 5\' 8"  (1.727 m)  Wt 225 lb (102.059 kg)  BMI 34.21 kg/m2  SpO2 96%  Physical Exam  Nursing note and vitals reviewed. Constitutional: He is oriented to person, place, and time. He appears well-developed and well-nourished.  HENT:  Head: Normocephalic and atraumatic.  Eyes: EOM are normal.  Neck: Normal range of motion. No tracheal deviation present.  Cardiovascular: Normal rate, regular rhythm and normal heart sounds.   No murmur heard. Pulmonary/Chest: Effort normal and breath sounds normal. He has no wheezes.  Abdominal: Soft. Bowel sounds are normal. There is no tenderness.  Musculoskeletal: Normal range of motion. He exhibits no edema.  Neurological: He is alert and oriented to person,  place, and time.  Skin: Skin is warm.  Psychiatric: He has a normal mood and affect.    ED Course  Procedures (including critical care time) DIAGNOSTIC STUDIES: Oxygen Saturation is 96% on room air, adequate by my interpretation.    COORDINATION OF CARE: 11:21- Patient informed of clinical course, understands medical decision-making process, and agrees with plan.     Labs Reviewed - No data to display Dg Chest 2 View  01/22/2012  *RADIOLOGY REPORT*  Clinical Data: Rib injury.  Left-sided chest pain, cough, congestion.  CHEST - 2 VIEW  Comparison: 01/19/2012  Findings:  Cardiomediastinal silhouette is within normal limits. The lungs are free of focal consolidations and pleural effusions. No evidence for pneumothorax.  No displaced, acute rib fracture identified.  IMPRESSION: Negative exam.   Original Report Authenticated By: Patterson Hammersmith, M.D.     No results found.   1. Rib pain on left side       MDM  X-rays from October 6 reviewed. Patient's symptoms are consistent with a left-sided chest wall pain suggestive of rib pain. Around 6-8 rib area. Patient's lungs are clear. No symptoms suggestive of pneumonia. Treated with anti-inflammatory medicine and pain medicine.  I personally performed the services described in this documentation, which was scribed in my presence. The recorded information has been reviewed and considered.         [name], MD 02/13/12 [phone]

## 2012-02-20 ENCOUNTER — Emergency Department (HOSPITAL_COMMUNITY)
Admission: EM | Admit: 2012-02-20 | Discharge: 2012-02-20 | Disposition: A | Discharge status: Home / Self Care | Payer: Self-pay | Attending: Emergency Medicine | Admitting: Emergency Medicine

## 2012-02-20 ENCOUNTER — Encounter (HOSPITAL_COMMUNITY): Payer: Self-pay

## 2012-02-20 ENCOUNTER — Emergency Department (HOSPITAL_COMMUNITY): Payer: Self-pay

## 2012-02-20 DIAGNOSIS — Y9389 Activity, other specified: Secondary | ICD-10-CM | POA: Insufficient documentation

## 2012-02-20 DIAGNOSIS — S2239XA Fracture of one rib, unspecified side, initial encounter for closed fracture: Secondary | ICD-10-CM

## 2012-02-20 DIAGNOSIS — Y929 Unspecified place or not applicable: Secondary | ICD-10-CM | POA: Insufficient documentation

## 2012-02-20 DIAGNOSIS — X58XXXA Exposure to other specified factors, initial encounter: Secondary | ICD-10-CM | POA: Insufficient documentation

## 2012-02-20 DIAGNOSIS — S2249XA Multiple fractures of ribs, unspecified side, initial encounter for closed fracture: Secondary | ICD-10-CM | POA: Insufficient documentation

## 2012-02-20 DIAGNOSIS — Z791 Long term (current) use of non-steroidal anti-inflammatories (NSAID): Secondary | ICD-10-CM | POA: Insufficient documentation

## 2012-02-20 DIAGNOSIS — F17200 Nicotine dependence, unspecified, uncomplicated: Secondary | ICD-10-CM | POA: Insufficient documentation

## 2012-02-20 DIAGNOSIS — R059 Cough, unspecified: Secondary | ICD-10-CM | POA: Insufficient documentation

## 2012-02-20 DIAGNOSIS — Z8719 Personal history of other diseases of the digestive system: Secondary | ICD-10-CM | POA: Insufficient documentation

## 2012-02-20 DIAGNOSIS — R05 Cough: Secondary | ICD-10-CM | POA: Insufficient documentation

## 2012-02-20 MED ORDER — HYDROCODONE-ACETAMINOPHEN 5-325 MG PO TABS
1.0000 | ORAL_TABLET | Freq: Once | ORAL | Status: AC
  Administered 2012-02-20: 1 via ORAL
  Filled 2012-02-20: qty 1

## 2012-02-20 MED ORDER — HYDROCODONE-ACETAMINOPHEN 5-325 MG PO TABS: 1.0000 | ORAL_TABLET | Freq: Four times a day (QID) | ORAL | Status: DC | PRN

## 2012-02-20 NOTE — ED Provider Notes (Signed)
History   This chart was scribed for Dustin Lennert, MD by Charolett Bumpers . The patient was seen in room APA16A/APA16A. Patient's care was started at 1222.   CSN: [id number]  Arrival date & time 02/20/12  4098   First MD Initiated Contact with Patient 02/20/12 1222      Chief Complaint  Patient presents with  . Back Pain    HPI Comments: Dustin Rhodes is a 26 y.o. male who presents to the Emergency Department complaining of moderate mid upper back pain that started 2 weeks ago after coughing. He states he heard a popping noise in his back at that time. He states he self treated with goody's powders and returned to ED for vomiting. He states that after coughing last night, he had a similar popping pain that made his back pain worse and is now constant.   Patient is a 26 y.o. male presenting with back pain. The history is provided by the patient. No language interpreter was used.  Back Pain  This is a new problem. The problem has been gradually worsening. The pain is associated with no known injury. The pain is present in the thoracic spine. The pain does not radiate. The pain is moderate. Exacerbated by: coughing. Pertinent negatives include no chest pain, no headaches and no abdominal pain.    Past Medical History  Diagnosis Date  . ADHD (attention deficit hyperactivity disorder)   . Acid reflux     Past Surgical History  Procedure Date  . Tympanoplasty   . Wisdom tooth extraction     Family History  Problem Relation Age of Onset  . Asthma Mother   . Migraines Mother   . Cancer Other   . Heart attack Other   . Alzheimer's disease Other     History  Substance Use Topics  . Smoking status: Current Every Day Smoker -- 1.0 packs/day for 10 years    Types: Cigarettes  . Smokeless tobacco: Former Neurosurgeon  . Alcohol Use: No      Review of Systems  Constitutional: Negative for fatigue.  HENT: Negative for congestion, sinus pressure and ear discharge.   Eyes:  Negative for discharge.  Respiratory: Negative for cough.   Cardiovascular: Negative for chest pain.  Gastrointestinal: Negative for abdominal pain and diarrhea.  Genitourinary: Negative for frequency and hematuria.  Musculoskeletal: Positive for back pain.  Skin: Negative for rash.  Neurological: Negative for seizures and headaches.  Hematological: Negative.   Psychiatric/Behavioral: Negative for hallucinations.  All other systems reviewed and are negative.    Allergies  Goodys body pain and Tramadol  Home Medications   Current Outpatient Rx  Name  Route  Sig  Dispense  Refill  . NAPROXEN 500 MG PO TABS   Oral   Take 1 tablet (500 mg total) by mouth 2 (two) times daily.   14 tablet   0     BP 139/95  Pulse 72  Temp 98 F (36.7 C) (Oral)  Resp 20  Ht 5\' 8"  (1.727 m)  Wt 235 lb (106.595 kg)  BMI 35.73 kg/m2  SpO2 97%  Physical Exam  Constitutional: He is oriented to person, place, and time. He appears well-developed.  HENT:  Head: Normocephalic and atraumatic.  Eyes: Conjunctivae normal and EOM are normal. No scleral icterus.  Neck: Neck supple. No thyromegaly present.  Cardiovascular: Normal rate, regular rhythm and normal heart sounds.  Exam reveals no gallop and no friction rub.   No murmur heard.  Pulmonary/Chest: Effort normal and breath sounds normal. No stridor. He has no wheezes. He has no rales. He exhibits no tenderness.  Abdominal: Soft. He exhibits no distension. There is no tenderness. There is no rebound.  Musculoskeletal: Normal range of motion. He exhibits tenderness. He exhibits no edema.       Left lateral posterior rib tenderness.   Lymphadenopathy:    He has no cervical adenopathy.  Neurological: He is oriented to person, place, and time. Coordination normal.  Skin: No rash noted. No erythema.  Psychiatric: He has a normal mood and affect. His behavior is normal.    ED Course  Procedures (including critical care time)  DIAGNOSTIC  STUDIES: Oxygen Saturation is 97% on room air, normal by my interpretation.    COORDINATION OF CARE:  12:27-Discussed planned course of treatment with the patient including an x-ray of left ribs with chest, who is agreeable at this time.   Labs Reviewed - No data to display No results found.   No diagnosis found.    MDM     The chart was scribed for me under my direct supervision.  I personally performed the history, physical, and medical decision making and all procedures in the evaluation of this patient.Dustin Lennert, MD 02/20/12 [phone]

## 2012-02-20 NOTE — ED Notes (Signed)
Pt states he felt something pop in the center of his chest after coughing. States this is the third time he has been here for the same. Sates the last time they only x-rayed the right side when the pain is on the left

## 2012-04-10 ENCOUNTER — Emergency Department (HOSPITAL_COMMUNITY): Payer: Self-pay

## 2012-04-10 ENCOUNTER — Encounter (HOSPITAL_COMMUNITY): Payer: Self-pay | Admitting: Emergency Medicine

## 2012-04-10 ENCOUNTER — Emergency Department (HOSPITAL_COMMUNITY)
Admission: EM | Admit: 2012-04-10 | Discharge: 2012-04-10 | Disposition: A | Discharge status: Home / Self Care | Payer: Self-pay | Attending: Emergency Medicine | Admitting: Emergency Medicine

## 2012-04-10 DIAGNOSIS — W010XXA Fall on same level from slipping, tripping and stumbling without subsequent striking against object, initial encounter: Secondary | ICD-10-CM | POA: Insufficient documentation

## 2012-04-10 DIAGNOSIS — Y929 Unspecified place or not applicable: Secondary | ICD-10-CM | POA: Insufficient documentation

## 2012-04-10 DIAGNOSIS — S20219A Contusion of unspecified front wall of thorax, initial encounter: Secondary | ICD-10-CM | POA: Insufficient documentation

## 2012-04-10 DIAGNOSIS — F909 Attention-deficit hyperactivity disorder, unspecified type: Secondary | ICD-10-CM | POA: Insufficient documentation

## 2012-04-10 DIAGNOSIS — Z8719 Personal history of other diseases of the digestive system: Secondary | ICD-10-CM | POA: Insufficient documentation

## 2012-04-10 DIAGNOSIS — Y9389 Activity, other specified: Secondary | ICD-10-CM | POA: Insufficient documentation

## 2012-04-10 DIAGNOSIS — Z8781 Personal history of (healed) traumatic fracture: Secondary | ICD-10-CM | POA: Insufficient documentation

## 2012-04-10 DIAGNOSIS — F17200 Nicotine dependence, unspecified, uncomplicated: Secondary | ICD-10-CM | POA: Insufficient documentation

## 2012-04-10 MED ORDER — HYDROCODONE-ACETAMINOPHEN 5-325 MG PO TABS: 1.0000 | ORAL_TABLET | Freq: Four times a day (QID) | ORAL | Status: DC | PRN

## 2012-04-10 MED ORDER — NAPROXEN 500 MG PO TABS: 500.0000 mg | ORAL_TABLET | Freq: Two times a day (BID) | ORAL | Status: DC

## 2012-04-10 NOTE — ED Provider Notes (Signed)
History   This chart was scribed for Benny Lennert, MD by Charolett Bumpers, ED Scribe. The patient was seen in room APA09/APA09. Patient's care was started at 1135.   CSN: [id number]  Arrival date & time 04/10/12  1016   First MD Initiated Contact with Patient 04/10/12 1135      Chief Complaint  Patient presents with  . Pain    HPI Comments: Dustin Rhodes is a 26 y.o. male who presents to the Emergency Department complaining of constant, severe left lateral rib pain after falling last night. He states that he fell while putting up christmas lights. He states that he tripped and fell, landing on his left side causing his left elbow to going into his ribs. He states that he fractured 2 ribs 2 months ago and they are the same ribs that hurt today. He states his symptoms are worse with movements and deep breaths.   Patient is a 26 y.o. male presenting with fall. The history is provided by the patient. No language interpreter was used.  Fall The accident occurred yesterday. Point of impact: Left side. Pain location: left lateral ribs. The pain is severe. Pertinent negatives include no abdominal pain, no hematuria and no headaches.    Past Medical History  Diagnosis Date  . ADHD (attention deficit hyperactivity disorder)   . Acid reflux     Past Surgical History  Procedure Date  . Tympanoplasty   . Wisdom tooth extraction     Family History  Problem Relation Age of Onset  . Asthma Mother   . Migraines Mother   . Cancer Other   . Heart attack Other   . Alzheimer's disease Other     History  Substance Use Topics  . Smoking status: Current Every Day Smoker -- 1.0 packs/day for 10 years    Types: Cigarettes  . Smokeless tobacco: Former Neurosurgeon  . Alcohol Use: No      Review of Systems  Constitutional: Negative for fatigue.  HENT: Negative for congestion, sinus pressure and ear discharge.   Eyes: Negative for discharge.  Respiratory: Negative for cough.    Cardiovascular: Negative for chest pain.  Gastrointestinal: Negative for abdominal pain and diarrhea.  Genitourinary: Negative for frequency and hematuria.  Musculoskeletal: Positive for arthralgias. Negative for back pain.       Left lateral rib pain.   Skin: Negative for rash.  Neurological: Negative for seizures and headaches.  Hematological: Negative.   Psychiatric/Behavioral: Negative for hallucinations.  All other systems reviewed and are negative.    Allergies  Goodys body pain and Tramadol  Home Medications   Current Outpatient Rx  Name  Route  Sig  Dispense  Refill  . NAPROXEN 500 MG PO TABS   Oral   Take 1 tablet (500 mg total) by mouth 2 (two) times daily.   14 tablet   0     BP 150/89  Pulse 82  Temp 98 F (36.7 C)  Resp 18  Ht 5\' 8"  (1.727 m)  Wt 235 lb (106.595 kg)  BMI 35.73 kg/m2  SpO2 99%  Physical Exam  Nursing note and vitals reviewed. Constitutional: He is oriented to person, place, and time. He appears well-developed.  HENT:  Head: Normocephalic and atraumatic.  Eyes: Conjunctivae normal and EOM are normal. No scleral icterus.  Neck: Neck supple. No thyromegaly present.  Cardiovascular: Normal rate and regular rhythm.  Exam reveals no gallop and no friction rub.   No murmur heard. Pulmonary/Chest:  No stridor. He has no wheezes. He has no rales. He exhibits no tenderness.  Abdominal: He exhibits no distension. There is no tenderness. There is no rebound.  Musculoskeletal: Normal range of motion. He exhibits tenderness. He exhibits no edema.       Mild anterior left lateral rib tenderness.   Lymphadenopathy:    He has no cervical adenopathy.  Neurological: He is oriented to person, place, and time. Coordination normal.  Skin: No rash noted. No erythema.  Psychiatric: He has a normal mood and affect. His behavior is normal.    ED Course  Procedures (including critical care time)  DIAGNOSTIC STUDIES: Oxygen Saturation is 99% on room  air, normal by my interpretation.    COORDINATION OF CARE:  11:40-Informed pt of imaging results. No new fractures noted. Discussed planned course of treatment with the patient including d/c home with a prescription for Naproxen and Vicodin, who is agreeable at this time.    Labs Reviewed - No data to display Dg Ribs Unilateral W/chest Left  04/10/2012  *RADIOLOGY REPORT*  Clinical Data: Pain, fall  LEFT RIBS AND CHEST - 3+ VIEW  Comparison: 02/20/2012  Findings: Four views left ribs submitted.  Cardiomediastinal silhouette is stable.  No acute infiltrate or pulmonary edema. No diagnostic pneumothorax.  Again noted mild displaced fracture of the left posterior tenth and eleventh ribs.   IMPRESSION:  No acute infiltrate or pulmonary edema. No diagnostic pneumothorax.  Again noted mild displaced fracture of the left posterior tenth and eleventh ribs.   Original Report Authenticated By: Natasha Mead, M.D.      No diagnosis found.    MDM  The chart was scribed for me under my direct supervision.  I personally performed the history, physical, and medical decision making and all procedures in the evaluation of this patient.Benny Lennert, MD 04/10/12 1153

## 2012-04-10 NOTE — ED Notes (Signed)
Pt c/o pain from under left armpit down left side to left ribcage after tripping and falling last night. Pt states he had 2 broken ribs x 2 months ago, but this pain is worse. Some nausea this am.

## 2012-04-10 NOTE — ED Notes (Signed)
Pt c/o pain to left rib cage area after falling to the floor a few days ago, pt states that his left elbow went into his ribs, concerned because he had two fx ribs on left side a few months ago, pain is increased with resp, movement,

## 2012-04-17 ENCOUNTER — Emergency Department (HOSPITAL_COMMUNITY)
Admission: EM | Admit: 2012-04-17 | Discharge: 2012-04-17 | Disposition: A | Discharge status: Home / Self Care | Payer: Self-pay | Attending: Emergency Medicine | Admitting: Emergency Medicine

## 2012-04-17 ENCOUNTER — Encounter (HOSPITAL_COMMUNITY): Payer: Self-pay | Admitting: *Deleted

## 2012-04-17 ENCOUNTER — Emergency Department (HOSPITAL_COMMUNITY): Payer: Self-pay

## 2012-04-17 DIAGNOSIS — S298XXA Other specified injuries of thorax, initial encounter: Secondary | ICD-10-CM | POA: Insufficient documentation

## 2012-04-17 DIAGNOSIS — K219 Gastro-esophageal reflux disease without esophagitis: Secondary | ICD-10-CM | POA: Insufficient documentation

## 2012-04-17 DIAGNOSIS — R079 Chest pain, unspecified: Secondary | ICD-10-CM | POA: Insufficient documentation

## 2012-04-17 DIAGNOSIS — R05 Cough: Secondary | ICD-10-CM | POA: Insufficient documentation

## 2012-04-17 DIAGNOSIS — R296 Repeated falls: Secondary | ICD-10-CM | POA: Insufficient documentation

## 2012-04-17 DIAGNOSIS — S20219A Contusion of unspecified front wall of thorax, initial encounter: Secondary | ICD-10-CM | POA: Insufficient documentation

## 2012-04-17 DIAGNOSIS — Y929 Unspecified place or not applicable: Secondary | ICD-10-CM | POA: Insufficient documentation

## 2012-04-17 DIAGNOSIS — Y9389 Activity, other specified: Secondary | ICD-10-CM | POA: Insufficient documentation

## 2012-04-17 DIAGNOSIS — F909 Attention-deficit hyperactivity disorder, unspecified type: Secondary | ICD-10-CM | POA: Insufficient documentation

## 2012-04-17 DIAGNOSIS — F17200 Nicotine dependence, unspecified, uncomplicated: Secondary | ICD-10-CM | POA: Insufficient documentation

## 2012-04-17 DIAGNOSIS — R059 Cough, unspecified: Secondary | ICD-10-CM | POA: Insufficient documentation

## 2012-04-17 MED ORDER — HYDROCODONE-ACETAMINOPHEN 5-325 MG PO TABS: 1.0000 | ORAL_TABLET | Freq: Four times a day (QID) | ORAL | Status: DC | PRN

## 2012-04-17 MED ORDER — NAPROXEN 500 MG PO TABS: 500.0000 mg | ORAL_TABLET | Freq: Two times a day (BID) | ORAL | Status: DC

## 2012-04-17 NOTE — ED Provider Notes (Signed)
History  This chart was scribed for Dustin Lennert, MD by Shari Heritage, ED Scribe. The patient was seen in room APA12/APA12. Patient's care was started at 1556.  CSN: [id number]  Arrival date & time 04/17/12  1533   First MD Initiated Contact with Patient 04/17/12 1556      Chief Complaint  Patient presents with  . Rib Injury  . Cough     Patient is a 26 y.o. male presenting with chest pain. The history is provided by the patient. No language interpreter was used.  Chest Pain The chest pain began 3 - 5 hours ago. Chest pain occurs constantly. The pain is associated with breathing. The severity of the pain is severe. The pain does not radiate. Pertinent negatives for primary symptoms include no fatigue, no cough and no abdominal pain. He tried narcotics for the symptoms.  Pertinent negatives for past medical history include no seizures.     HPI Comments: Dustin Rhodes is a 26 y.o. male with 38-month history of rib fracture who presents to the Emergency Department complaining of constant, moderate to severe, non-radiating left lateral rib pain. Patient states that pain is worse with deep breaths and palpation. Patient states that he "felt a rib pop" last night after taking a deep breath. Patient was last seen here on 04/10/12 by me complaining of constant, severe left lateral rib pain after falling while putting up Christmas lights. Patient's x-ray during that visit showed no knew fractures. Patient was discharged home with prescriptions for naproxen and vicodin. He says he still has some of his medicines left. Patient states that he returned to the ED today due to concern over puncturing a lung. Patient has a medical history of ADHD and acid reflux. Patient is a current every day smoker.   Past Medical History  Diagnosis Date  . ADHD (attention deficit hyperactivity disorder)   . Acid reflux     Past Surgical History  Procedure Date  . Tympanoplasty   . Wisdom tooth extraction      Family History  Problem Relation Age of Onset  . Asthma Mother   . Migraines Mother   . Cancer Other   . Heart attack Other   . Alzheimer's disease Other     History  Substance Use Topics  . Smoking status: Current Every Day Smoker -- 1.0 packs/day for 10 years    Types: Cigarettes  . Smokeless tobacco: Former Neurosurgeon  . Alcohol Use: No      Review of Systems  Constitutional: Negative for fatigue.  HENT: Negative for congestion, sinus pressure and ear discharge.   Eyes: Negative for discharge.  Respiratory: Negative for cough.   Cardiovascular: Positive for chest pain.  Gastrointestinal: Negative for abdominal pain and diarrhea.  Genitourinary: Negative for frequency and hematuria.  Musculoskeletal: Negative for back pain.  Skin: Negative for rash.  Neurological: Negative for seizures and headaches.  Hematological: Negative.   Psychiatric/Behavioral: Negative for hallucinations.    Allergies  Goodys body pain and Tramadol  Home Medications   Current Outpatient Rx  Name  Route  Sig  Dispense  Refill  . HYDROCODONE-ACETAMINOPHEN 5-325 MG PO TABS   Oral   Take 1 tablet by mouth every 6 (six) hours as needed for pain.   40 tablet   0   . NAPROXEN 500 MG PO TABS   Oral   Take 1 tablet (500 mg total) by mouth 2 (two) times daily.   14 tablet   0   .  NAPROXEN 500 MG PO TABS   Oral   Take 1 tablet (500 mg total) by mouth 2 (two) times daily.   30 tablet   0     Triage Vitals: BP 140/87  Pulse 64  Temp 97.6 F (36.4 C) (Oral)  Resp 17  Ht 5\' 8"  (1.727 m)  Wt 230 lb (104.327 kg)  BMI 34.97 kg/m2  SpO2 97%  Physical Exam  Constitutional: He is oriented to person, place, and time. He appears well-developed.  HENT:  Head: Normocephalic and atraumatic.  Eyes: Conjunctivae normal and EOM are normal. No scleral icterus.  Neck: Neck supple. No thyromegaly present.  Cardiovascular: Normal rate and regular rhythm.  Exam reveals no gallop and no friction  rub.   No murmur heard. Pulmonary/Chest: No stridor. He has no wheezes. He has no rales. He exhibits bony tenderness. He exhibits no tenderness.       Tender to left lower lateral ribs.  Abdominal: He exhibits no distension. There is no tenderness. There is no rebound.  Musculoskeletal: Normal range of motion. He exhibits tenderness. He exhibits no edema.  Lymphadenopathy:    He has no cervical adenopathy.  Neurological: He is oriented to person, place, and time. Coordination normal.  Skin: No rash noted. No erythema.  Psychiatric: He has a normal mood and affect. His behavior is normal.    ED Course  Procedures (including critical care time) DIAGNOSTIC STUDIES: Oxygen Saturation is 97% on room air, adequate by my interpretation.    COORDINATION OF CARE: 4:05 PM- Patient informed of current plan for treatment and evaluation and agrees with plan at this time.      Labs Reviewed - No data to display No results found.   No diagnosis found.    MDM        The chart was scribed for me under my direct supervision.  I personally performed the history, physical, and medical decision making and all procedures in the evaluation of this patient.Dustin Lennert, MD 04/17/12 [phone]

## 2012-04-17 NOTE — ED Notes (Signed)
Pt seen 2 months prior for rib injury/fx , seen 2 weeks prior for re-injury to rt side, coughed this morning and felt a pop in lt side, hurts to take deep breath.

## 2012-04-24 ENCOUNTER — Encounter (HOSPITAL_COMMUNITY): Payer: Self-pay | Admitting: *Deleted

## 2012-04-24 ENCOUNTER — Emergency Department (HOSPITAL_COMMUNITY)
Admission: EM | Admit: 2012-04-24 | Discharge: 2012-04-24 | Disposition: A | Discharge status: Home / Self Care | Payer: Self-pay | Attending: Emergency Medicine | Admitting: Emergency Medicine

## 2012-04-24 DIAGNOSIS — R071 Chest pain on breathing: Secondary | ICD-10-CM | POA: Insufficient documentation

## 2012-04-24 DIAGNOSIS — R109 Unspecified abdominal pain: Secondary | ICD-10-CM | POA: Insufficient documentation

## 2012-04-24 DIAGNOSIS — Z8781 Personal history of (healed) traumatic fracture: Secondary | ICD-10-CM | POA: Insufficient documentation

## 2012-04-24 DIAGNOSIS — Z8659 Personal history of other mental and behavioral disorders: Secondary | ICD-10-CM | POA: Insufficient documentation

## 2012-04-24 DIAGNOSIS — F17200 Nicotine dependence, unspecified, uncomplicated: Secondary | ICD-10-CM | POA: Insufficient documentation

## 2012-04-24 DIAGNOSIS — F411 Generalized anxiety disorder: Secondary | ICD-10-CM | POA: Insufficient documentation

## 2012-04-24 DIAGNOSIS — R110 Nausea: Secondary | ICD-10-CM | POA: Insufficient documentation

## 2012-04-24 DIAGNOSIS — Z79899 Other long term (current) drug therapy: Secondary | ICD-10-CM | POA: Insufficient documentation

## 2012-04-24 DIAGNOSIS — K219 Gastro-esophageal reflux disease without esophagitis: Secondary | ICD-10-CM | POA: Insufficient documentation

## 2012-04-24 NOTE — ED Provider Notes (Signed)
History     CSN: [id number]  Arrival date & time 04/24/12  1224   First MD Initiated Contact with Patient 04/24/12 1240      Chief Complaint  Patient presents with  . Rib Fracture    (Consider location/radiation/quality/duration/timing/severity/associated sxs/prior treatment) HPI Comments: Patient states that he sustained a fracture of 2 ribs in November of 2013. He's been having some problem in that area since November particularly with certain movement. 4 days ago he states that he coughed and after that he felt a bulge and felt as though there was fluid moving between his lungs and his skin. The patient became quite frightened and presents today for additional evaluation of this problem. The patient states that he has not noted any hemoptysis. He has not had any high fever. There's been no blunt injury or trauma to the flank area on the left. The patient has taken Tylenol and Naprosyn, but states these did not help.  The history is provided by the patient.    Past Medical History  Diagnosis Date  . ADHD (attention deficit hyperactivity disorder)   . Acid reflux     Past Surgical History  Procedure Date  . Tympanoplasty   . Wisdom tooth extraction     Family History  Problem Relation Age of Onset  . Asthma Mother   . Migraines Mother   . Cancer Other   . Heart attack Other   . Alzheimer's disease Other     History  Substance Use Topics  . Smoking status: Current Every Day Smoker -- 1.0 packs/day for 10 years    Types: Cigarettes  . Smokeless tobacco: Former Neurosurgeon  . Alcohol Use: No      Review of Systems  Constitutional: Negative for activity change.       All ROS Neg except as noted in HPI  HENT: Negative for nosebleeds and neck pain.   Eyes: Negative for photophobia and discharge.  Respiratory: Negative for cough, shortness of breath and wheezing.   Cardiovascular: Negative for chest pain and palpitations.  Gastrointestinal: Negative for abdominal pain and  blood in stool.       Gastroesophageal reflux  Genitourinary: Negative for dysuria, frequency and hematuria.  Musculoskeletal: Negative for back pain and arthralgias.       Chest wall pain  Skin: Negative.   Neurological: Negative for dizziness, seizures and speech difficulty.  Psychiatric/Behavioral: Negative for hallucinations and confusion. The patient is nervous/anxious.     Allergies  Goodys body pain and Tramadol  Home Medications   Current Outpatient Rx  Name  Route  Sig  Dispense  Refill  . HYDROCODONE-ACETAMINOPHEN 5-325 MG PO TABS   Oral   Take 1 tablet by mouth every 6 (six) hours as needed for pain.   40 tablet   0   . HYDROCODONE-ACETAMINOPHEN 5-325 MG PO TABS   Oral   Take 1 tablet by mouth every 6 (six) hours as needed for pain.   20 tablet   0   . NAPROXEN 500 MG PO TABS   Oral   Take 1 tablet (500 mg total) by mouth 2 (two) times daily.   30 tablet   1     BP 137/99  Pulse 68  Temp 97.6 F (36.4 C) (Oral)  Resp 20  Ht 5\' 8"  (1.727 m)  Wt 235 lb (106.595 kg)  BMI 35.73 kg/m2  SpO2 98%  Physical Exam  Nursing note and vitals reviewed. Constitutional: He is oriented to person, place,  and time. He appears well-developed and well-nourished.  Non-toxic appearance.  HENT:  Head: Normocephalic.  Right Ear: Tympanic membrane and external ear normal.  Left Ear: Tympanic membrane and external ear normal.  Eyes: EOM and lids are normal. Pupils are equal, round, and reactive to light.  Neck: Normal range of motion. Neck supple. Carotid bruit is not present.  Cardiovascular: Normal rate, regular rhythm, normal heart sounds, intact distal pulses and normal pulses.   Pulmonary/Chest: Breath sounds normal. No respiratory distress.       There is soreness to palpation over the left lower flank area. Anterior more than posterior. There is no crepitus. The area is not hot or red or swollen. There is symmetrical rise and fall of the chest. There are few scattered  rhonchi present, but no decrease in breath sounds.  Abdominal: Soft. Bowel sounds are normal. There is no tenderness. There is no guarding.  Musculoskeletal: Normal range of motion.  Lymphadenopathy:       Head (right side): No submandibular adenopathy present.       Head (left side): No submandibular adenopathy present.    He has no cervical adenopathy.  Neurological: He is alert and oriented to person, place, and time. He has normal strength. No cranial nerve deficit or sensory deficit.  Skin: Skin is warm and dry.  Psychiatric: His speech is normal. His mood appears anxious.    ED Course  Procedures (including critical care time)  Labs Reviewed - No data to display No results found. Pulse oximetry 98% on room air. Within normal limits by my interpretation.  No diagnosis found.    MDM  I have reviewed nursing notes, vital signs, and all appropriate lab and imaging results for this patient. The previous records from November and December were reviewed by me. The chest x-rays and rib films from November and December were reviewed by me.  The vital signs are well within normal limits, and the pulse oximetry is 98%. No new changes appreciated on examination at this time. Patient advised of the vital signs and clinical findings today. Patient reassured that no acute changes or problems occurring at this time. Patient to see his primary physician for additional workup and evaluation of his flank pain and rib fracture. Patient to return to the emergency department if any emergent changes or concerns.       Kathie Dike, [place] 04/24/12 1327

## 2012-04-24 NOTE — ED Notes (Addendum)
Pt presents to er today for continued pain to left rib area, states that he has been having problems with left rib area, has hx of two fractures, pain became worse after coughing 4 days ago, pt states that the area is tender to palpation, pain is making pt nauseous.  Pt states that the pain is worse with cough, certain positions,

## 2012-04-28 NOTE — ED Provider Notes (Signed)
Medical screening examination/treatment/procedure(s) were performed by non-physician practitioner and as supervising physician I was immediately available for consultation/collaboration.  Donnetta Hutching, MD 04/28/12 [phone]

## 2012-06-07 ENCOUNTER — Encounter (HOSPITAL_COMMUNITY): Payer: Self-pay

## 2012-06-07 ENCOUNTER — Emergency Department (HOSPITAL_COMMUNITY)
Admission: EM | Admit: 2012-06-07 | Discharge: 2012-06-07 | Disposition: A | Discharge status: Home / Self Care | Payer: Self-pay | Attending: Emergency Medicine | Admitting: Emergency Medicine

## 2012-06-07 DIAGNOSIS — Z9889 Other specified postprocedural states: Secondary | ICD-10-CM | POA: Insufficient documentation

## 2012-06-07 DIAGNOSIS — L738 Other specified follicular disorders: Secondary | ICD-10-CM | POA: Insufficient documentation

## 2012-06-07 DIAGNOSIS — K029 Dental caries, unspecified: Secondary | ICD-10-CM | POA: Insufficient documentation

## 2012-06-07 DIAGNOSIS — K089 Disorder of teeth and supporting structures, unspecified: Secondary | ICD-10-CM | POA: Insufficient documentation

## 2012-06-07 DIAGNOSIS — R51 Headache: Secondary | ICD-10-CM | POA: Insufficient documentation

## 2012-06-07 DIAGNOSIS — F17200 Nicotine dependence, unspecified, uncomplicated: Secondary | ICD-10-CM | POA: Insufficient documentation

## 2012-06-07 DIAGNOSIS — Z8719 Personal history of other diseases of the digestive system: Secondary | ICD-10-CM | POA: Insufficient documentation

## 2012-06-07 DIAGNOSIS — R110 Nausea: Secondary | ICD-10-CM | POA: Insufficient documentation

## 2012-06-07 DIAGNOSIS — M542 Cervicalgia: Secondary | ICD-10-CM | POA: Insufficient documentation

## 2012-06-07 DIAGNOSIS — R220 Localized swelling, mass and lump, head: Secondary | ICD-10-CM | POA: Insufficient documentation

## 2012-06-07 DIAGNOSIS — Z8659 Personal history of other mental and behavioral disorders: Secondary | ICD-10-CM | POA: Insufficient documentation

## 2012-06-07 MED ORDER — HYDROCODONE-ACETAMINOPHEN 5-325 MG PO TABS: ORAL_TABLET | ORAL | Status: DC

## 2012-06-07 MED ORDER — SULFAMETHOXAZOLE-TRIMETHOPRIM 800-160 MG PO TABS: 1.0000 | ORAL_TABLET | Freq: Two times a day (BID) | ORAL | Status: DC

## 2012-06-07 MED ORDER — DOXYCYCLINE HYCLATE 100 MG PO TABS
100.0000 mg | ORAL_TABLET | Freq: Once | ORAL | Status: AC
  Administered 2012-06-07: 100 mg via ORAL
  Filled 2012-06-07: qty 1

## 2012-06-07 MED ORDER — ONDANSETRON 8 MG PO TBDP
8.0000 mg | ORAL_TABLET | Freq: Once | ORAL | Status: AC
  Administered 2012-06-07: 8 mg via ORAL
  Filled 2012-06-07: qty 1

## 2012-06-07 MED ORDER — HYDROCODONE-ACETAMINOPHEN 5-325 MG PO TABS
1.0000 | ORAL_TABLET | Freq: Once | ORAL | Status: AC
  Administered 2012-06-07: 1 via ORAL
  Filled 2012-06-07: qty 1

## 2012-06-07 NOTE — ED Notes (Signed)
Pt reports "knot" to the inside of his left jaw area.  Pt reports severe pain to his head and neck.  Pt also reports nausea.

## 2012-06-10 NOTE — ED Provider Notes (Signed)
History     CSN: [id number]  Arrival date & time 06/07/12  4098   First MD Initiated Contact with Patient 06/07/12 1216      Chief Complaint  Patient presents with  . Abscess    (Consider location/radiation/quality/duration/timing/severity/associated sxs/prior treatment) Patient is a 27 y.o. male presenting with abscess and tooth pain. The history is provided by the patient.  Abscess Location:  Face Facial abscess location: left submandibular area. Abscess quality: fluctuance, induration, painful and redness   Abscess quality: not draining, no itching, no warmth and not weeping   Red streaking: no   Duration:  2 days Progression:  Unchanged Pain details:    Quality:  Dull   Severity:  Mild Chronicity:  New Context: not diabetes and not skin injury   Relieved by:  Nothing Worsened by:  Nothing tried Ineffective treatments:  None tried Associated symptoms: headaches   Associated symptoms: no fatigue, no fever, no nausea and no vomiting   Headaches:    Severity:  Mild   Onset quality:  Gradual   Progression:  Unchanged   Chronicity:  New Risk factors: prior abscess   Dental PainThe primary symptoms include mouth pain and headaches. Primary symptoms do not include dental injury, fever, shortness of breath, sore throat, angioedema or cough. The symptoms are worsening. The symptoms are recurrent. The symptoms occur constantly.  Mouth pain occurs constantly. Mouth pain is unchanged. Affected locations include: teeth and gum(s).  The headache is not associated with eye pain or neck stiffness.  Additional symptoms include: gum tenderness. Additional symptoms do not include: dental sensitivity to temperature, trismus, jaw pain, facial swelling, trouble swallowing, pain with swallowing, swollen glands and fatigue. Medical issues include: smoking and periodontal disease.    Past Medical History  Diagnosis Date  . ADHD (attention deficit hyperactivity disorder)   . Acid reflux      Past Surgical History  Procedure Laterality Date  . Tympanoplasty    . Wisdom tooth extraction      Family History  Problem Relation Age of Onset  . Asthma Mother   . Migraines Mother   . Cancer Other   . Heart attack Other   . Alzheimer's disease Other     History  Substance Use Topics  . Smoking status: Current Every Day Smoker -- 1.00 packs/day for 10 years    Types: Cigarettes  . Smokeless tobacco: Former Neurosurgeon  . Alcohol Use: No      Review of Systems  Constitutional: Negative for fever, appetite change and fatigue.  HENT: Positive for dental problem. Negative for congestion, sore throat, facial swelling, trouble swallowing, neck pain and neck stiffness.   Eyes: Negative for pain and visual disturbance.  Respiratory: Negative for cough and shortness of breath.   Gastrointestinal: Negative for nausea and vomiting.  Skin:       Boil to left jaw  Neurological: Positive for headaches. Negative for dizziness and facial asymmetry.  Hematological: Negative for adenopathy.  All other systems reviewed and are negative.    Allergies  Goodys body pain and Tramadol  Home Medications   Current Outpatient Rx  Name  Route  Sig  Dispense  Refill  . HYDROcodone-acetaminophen (NORCO/VICODIN) 5-325 MG per tablet   Oral   Take 1 tablet by mouth every 6 (six) hours as needed for pain.         Marland Kitchen HYDROcodone-acetaminophen (NORCO/VICODIN) 5-325 MG per tablet      Take one-two tabs po q 4-6 hrs prn pain  15 tablet   0   . sulfamethoxazole-trimethoprim (SEPTRA DS) 800-160 MG per tablet   Oral   Take 1 tablet by mouth 2 (two) times daily.   28 tablet   0     BP 124/81  Pulse 87  Resp 18  SpO2 99%  Physical Exam  Nursing note and vitals reviewed. Constitutional: He is oriented to person, place, and time. He appears well-developed and well-nourished. No distress.  HENT:  Head: Normocephalic and atraumatic. No trismus in the jaw.    Right Ear: Tympanic  membrane and ear canal normal.  Left Ear: Tympanic membrane and ear canal normal.  Mouth/Throat: Uvula is midline, oropharynx is clear and moist and mucous membranes are normal. Dental caries present. No dental abscesses or edematous.    Neck: Normal range of motion. Neck supple.  Cardiovascular: Normal rate, regular rhythm and normal heart sounds.   No murmur heard. Pulmonary/Chest: Effort normal and breath sounds normal. No respiratory distress.  Musculoskeletal: Normal range of motion.  Lymphadenopathy:    He has no cervical adenopathy.  Neurological: He is alert and oriented to person, place, and time. He exhibits normal muscle tone. Coordination normal.  Skin: Skin is warm and dry.    ED Course  Procedures (including critical care time)  Labs Reviewed - No data to display No results found.   1. Pain, dental   2. Folliculitis       MDM     Airway patent.  No facial edema noted on exam.  Pt has a pea sized papule to the left submandibular area with slight erythema.  No fluctuance.  I&D not indicated at this time.  No dental abscess or trismus.  Pt agrees to warm compresses , antibiotics and will return here if the sx's worsen.  Prescribed: Bactrim DS norco #15    Emiya Loomer L. Marylene Masek, [place] 06/10/12 1459

## 2012-06-11 NOTE — ED Provider Notes (Signed)
Medical screening examination/treatment/procedure(s) were performed by non-physician practitioner and as supervising physician I was immediately available for consultation/collaboration.  Raeford Razor, MD 06/11/12 [phone]

## 2012-08-21 ENCOUNTER — Encounter (HOSPITAL_COMMUNITY): Payer: Self-pay

## 2012-08-21 ENCOUNTER — Emergency Department (HOSPITAL_COMMUNITY): Payer: Self-pay

## 2012-08-21 ENCOUNTER — Emergency Department (HOSPITAL_COMMUNITY)
Admission: EM | Admit: 2012-08-21 | Discharge: 2012-08-21 | Disposition: A | Discharge status: Home / Self Care | Payer: Self-pay | Attending: Emergency Medicine | Admitting: Emergency Medicine

## 2012-08-21 DIAGNOSIS — Z8659 Personal history of other mental and behavioral disorders: Secondary | ICD-10-CM | POA: Insufficient documentation

## 2012-08-21 DIAGNOSIS — M549 Dorsalgia, unspecified: Secondary | ICD-10-CM | POA: Insufficient documentation

## 2012-08-21 DIAGNOSIS — Y929 Unspecified place or not applicable: Secondary | ICD-10-CM | POA: Insufficient documentation

## 2012-08-21 DIAGNOSIS — S298XXA Other specified injuries of thorax, initial encounter: Secondary | ICD-10-CM | POA: Insufficient documentation

## 2012-08-21 DIAGNOSIS — R0781 Pleurodynia: Secondary | ICD-10-CM

## 2012-08-21 DIAGNOSIS — Y939 Activity, unspecified: Secondary | ICD-10-CM | POA: Insufficient documentation

## 2012-08-21 DIAGNOSIS — K219 Gastro-esophageal reflux disease without esophagitis: Secondary | ICD-10-CM | POA: Insufficient documentation

## 2012-08-21 DIAGNOSIS — F17200 Nicotine dependence, unspecified, uncomplicated: Secondary | ICD-10-CM | POA: Insufficient documentation

## 2012-08-21 DIAGNOSIS — X500XXA Overexertion from strenuous movement or load, initial encounter: Secondary | ICD-10-CM | POA: Insufficient documentation

## 2012-08-21 MED ORDER — IBUPROFEN 600 MG PO TABS: 600.0000 mg | ORAL_TABLET | Freq: Four times a day (QID) | ORAL | Status: DC | PRN

## 2012-08-21 MED ORDER — TRAMADOL HCL 50 MG PO TABS: 100.0000 mg | ORAL_TABLET | Freq: Four times a day (QID) | ORAL | Status: DC | PRN

## 2012-08-21 MED ORDER — METAXALONE 800 MG PO TABS: 800.0000 mg | ORAL_TABLET | Freq: Four times a day (QID) | ORAL | Status: DC

## 2012-08-21 MED ORDER — HYDROCODONE-ACETAMINOPHEN 5-325 MG PO TABS
2.0000 | ORAL_TABLET | Freq: Once | ORAL | Status: AC
  Administered 2012-08-21: 2 via ORAL

## 2012-08-21 NOTE — ED Provider Notes (Signed)
History  This chart was scribed for Ward Givens, MD by Bennett Scrape, ED Scribe. This patient was seen in room [facility]03/[facility]03 and the patient's care was started at 7:24 AM.  CSN: [id number]  Arrival date & time 08/21/12  0708   First MD Initiated Contact with Patient 08/21/12 [phone]      Chief Complaint  Patient presents with  . rib pain      Patient is a 27 y.o. male presenting with chest pain. The history is provided by the patient. No language interpreter was used.  Chest Pain Pain location:  L lateral chest Pain radiates to:  Mid back Pain radiates to the back: yes   Pain severity:  Mild Onset quality:  Sudden Duration:  1 hour Timing:  Constant Progression:  Worsening Chronicity:  New Relieved by:  Nothing Worsened by:  Deep breathing Associated symptoms: no cough, no fever and no shortness of breath      HPI Comments: Dustin Rhodes is a 27 y.o. male who presents to the Emergency Department complaining of one hour of sudden onset, gradually worsening, constant left posterior rib pain that radiates around to the left lateral chest worse in the back described as a "stinging" pain that started after feeling a "pop" while coughing this morning. He denies any recent cough and states that he was trying to clear his throat at the time. The pain is worsened by deep breathing and reports taking a hot shower with no improvement.  He reports having 2 broken ribs in October 2013 but denies that the pain is in the same area. He admits having one prior episode of shingles on his left arm with similar pain. He denies fevers, acute CP, acute back pain, SOB and cough as associated symptoms.    He is not currently on medication for ADHD but states that he controls his GERD with dieting and Tums. Pt is a current 0.5 ppd everyday smoker and is an occasional alcohol user. He states that he is trying to quit due to a new baby.  Pt denies having a PCP currently He states that he tried to follow  up with the health department 2 weeks ago but had to reschedule   Past Medical History  Diagnosis Date  . ADHD (attention deficit hyperactivity disorder)   . Acid reflux     Past Surgical History  Procedure Laterality Date  . Tympanoplasty    . Wisdom tooth extraction      Family History  Problem Relation Age of Onset  . Asthma Mother   . Migraines Mother   . Cancer Other   . Heart attack Other   . Alzheimer's disease Other     History  Substance Use Topics  . Smoking status: Current Every Day Smoker -- 1.00 packs/day for 10 years now down to 1/2 ppd    Types: Cigarettes  . Smokeless tobacco: Former Neurosurgeon  . Alcohol Use: occassional   Works at a [employer]   Review of Systems  Constitutional: Negative for fever.  Respiratory: Negative for cough and shortness of breath.   Cardiovascular: Positive for chest pain (left rib pain).  All other systems reviewed and are negative.    Allergies  Goodys body pain-"causes me to get sick" and Tramadol-"makes me drowsy"   Home Medications   TUMS OTC about twice a week  Triage Vitals: BP 128/95  Pulse 86  Resp 18  Ht 5\' 8"  (1.727 m)  Wt 235 lb (106.595 kg)  BMI 35.74 kg/m2  SpO2 98%  Laboratory interpretation all normal    Physical Exam  Nursing note and vitals reviewed. Constitutional: He is oriented to person, place, and time. He appears well-developed and well-nourished.  Non-toxic appearance. He does not appear ill. No distress.  HENT:  Head: Normocephalic and atraumatic.  Right Ear: External ear normal.  Left Ear: External ear normal.  Nose: Nose normal. No mucosal edema or rhinorrhea.  Mouth/Throat: Oropharynx is clear and moist and mucous membranes are normal. No dental abscesses or edematous.  Eyes: Conjunctivae and EOM are normal. Pupils are equal, round, and reactive to light.  Neck: Normal range of motion and full passive range of motion without pain. Neck supple.  Cardiovascular: Normal rate,  regular rhythm and normal heart sounds.  Exam reveals no gallop and no friction rub.   No murmur heard. Pulmonary/Chest: Effort normal and breath sounds normal. No respiratory distress. He has no wheezes. He has no rhonchi. He has no rales.   He exhibits tenderness (Diffuse dermatomal tenderness to posterior left chest, no crepitance, no bony deformity ). He exhibits no crepitus.  Abdominal: Soft. Normal appearance and bowel sounds are normal. He exhibits no distension. There is no tenderness. There is no rebound and no guarding.  Musculoskeletal: Normal range of motion. He exhibits no edema and no tenderness.  Moves all extremities well.   Neurological: He is alert and oriented to person, place, and time. He has normal strength. No cranial nerve deficit.  Skin: Skin is warm, dry and intact. No rash noted. No erythema. No pallor.  Psychiatric: He has a normal mood and affect. His speech is normal and behavior is normal. His mood appears not anxious.    ED Course  Procedures (including critical care time)  Medications  HYDROcodone-acetaminophen (NORCO/VICODIN) 5-325 MG per tablet 2 tablet (not administered)    DIAGNOSTIC STUDIES: Oxygen Saturation is 98% on room air, normal by my interpretation.    COORDINATION OF CARE: 7:29 AM-Informed pt that quitting smoking can improve GERD symptoms. Advised pt that his symptoms could be early shingles. Discussed treatment plan which includes Norco and CXR with pt at bedside and pt agreed to plan.   8:26 AM-Informed pt of radiology results. Discussed discharge plan which includes tramadol to take at night and a muscle relaxer to take during the day with pt and pt agreed to plan. Also advised pt to watch for the development of a rash and to take ibuprofen for pain during the day. Addressed symptoms to return for with pt.    Dg Ribs Unilateral W/chest Left  08/21/2012  *RADIOLOGY REPORT*  Clinical Data: Left-sided rib pain.  History of left rib  fractures.  LEFT RIBS AND CHEST - 3+ VIEW  Comparison: Chest x-ray 04/17/2012.  Findings: The cardiac silhouette, mediastinal and hilar contours are normal and stable.  The lungs are clear.  No pleural effusion or pneumothorax.  Dedicated views of the left ribs demonstrate remote healed ninth and tenth rib fractures and a remote ununited left eleventh rib fracture.  No definite acute rib fractures.  IMPRESSION:  1.  No acute cardiopulmonary findings. 2.  Remote healed ninth and tenth left rib fractures and remote ununited left eleventh rib fracture. 3.  No definite acute fracture.   Original Report Authenticated By: Rudie Meyer, M.D.      1. Back pain   2. Pleuritic chest pain     Discharge Medication List as of 08/21/2012  8:34 AM    START taking  these medications   Details  ibuprofen (ADVIL,MOTRIN) 600 MG tablet Take 1 tablet (600 mg total) by mouth every 6 (six) hours as needed for pain., Starting 08/21/2012, Until Discontinued, Print    metaxalone (SKELAXIN) 800 MG tablet Take 1 tablet (800 mg total) by mouth 4 (four) times daily., Starting 08/21/2012, Until Discontinued, Print    traMADol (ULTRAM) 50 MG tablet Take 2 tablets (100 mg total) by mouth every 6 (six) hours as needed for pain., Starting 08/21/2012, Until Discontinued, Print        Plan discharge  Devoria Albe, MD, FACEP   MDM    I personally performed the services described in this documentation, which was scribed in my presence. The recorded information has been reviewed and considered.  Devoria Albe, MD, [facility]    Ward Givens, MD 08/21/12 [phone]

## 2012-08-21 NOTE — ED Notes (Signed)
EDP in with pt 

## 2012-08-21 NOTE — ED Notes (Signed)
Pt reports had dizzy spell in xray.  Episode lasted for a few seconds.  Denies dizziness now.  VSS.

## 2012-08-21 NOTE — ED Notes (Signed)
Pt reports coughed this am and felt something pop in left ribs and had sudden onset of pain.  Reports fractured 2 ribs on left side in October.  Denies SOB.

## 2012-09-01 ENCOUNTER — Emergency Department (HOSPITAL_COMMUNITY)
Admission: EM | Admit: 2012-09-01 | Discharge: 2012-09-01 | Disposition: A | Discharge status: Home / Self Care | Payer: Self-pay | Attending: Emergency Medicine | Admitting: Emergency Medicine

## 2012-09-01 ENCOUNTER — Encounter (HOSPITAL_COMMUNITY): Payer: Self-pay

## 2012-09-01 DIAGNOSIS — R6889 Other general symptoms and signs: Secondary | ICD-10-CM | POA: Insufficient documentation

## 2012-09-01 DIAGNOSIS — J329 Chronic sinusitis, unspecified: Secondary | ICD-10-CM

## 2012-09-01 DIAGNOSIS — K219 Gastro-esophageal reflux disease without esophagitis: Secondary | ICD-10-CM | POA: Insufficient documentation

## 2012-09-01 DIAGNOSIS — F909 Attention-deficit hyperactivity disorder, unspecified type: Secondary | ICD-10-CM | POA: Insufficient documentation

## 2012-09-01 DIAGNOSIS — R059 Cough, unspecified: Secondary | ICD-10-CM | POA: Insufficient documentation

## 2012-09-01 DIAGNOSIS — Z79899 Other long term (current) drug therapy: Secondary | ICD-10-CM | POA: Insufficient documentation

## 2012-09-01 DIAGNOSIS — H9209 Otalgia, unspecified ear: Secondary | ICD-10-CM | POA: Insufficient documentation

## 2012-09-01 DIAGNOSIS — R05 Cough: Secondary | ICD-10-CM | POA: Insufficient documentation

## 2012-09-01 DIAGNOSIS — R040 Epistaxis: Secondary | ICD-10-CM

## 2012-09-01 DIAGNOSIS — J3489 Other specified disorders of nose and nasal sinuses: Secondary | ICD-10-CM | POA: Insufficient documentation

## 2012-09-01 DIAGNOSIS — R6883 Chills (without fever): Secondary | ICD-10-CM | POA: Insufficient documentation

## 2012-09-01 DIAGNOSIS — H53149 Visual discomfort, unspecified: Secondary | ICD-10-CM | POA: Insufficient documentation

## 2012-09-01 DIAGNOSIS — F17200 Nicotine dependence, unspecified, uncomplicated: Secondary | ICD-10-CM | POA: Insufficient documentation

## 2012-09-01 DIAGNOSIS — H5789 Other specified disorders of eye and adnexa: Secondary | ICD-10-CM | POA: Insufficient documentation

## 2012-09-01 DIAGNOSIS — H579 Unspecified disorder of eye and adnexa: Secondary | ICD-10-CM | POA: Insufficient documentation

## 2012-09-01 DIAGNOSIS — J40 Bronchitis, not specified as acute or chronic: Secondary | ICD-10-CM

## 2012-09-01 DIAGNOSIS — IMO0001 Reserved for inherently not codable concepts without codable children: Secondary | ICD-10-CM | POA: Insufficient documentation

## 2012-09-01 DIAGNOSIS — R51 Headache: Secondary | ICD-10-CM | POA: Insufficient documentation

## 2012-09-01 MED ORDER — ALBUTEROL SULFATE HFA 108 (90 BASE) MCG/ACT IN AERS
2.0000 | INHALATION_SPRAY | Freq: Four times a day (QID) | RESPIRATORY_TRACT | Status: DC
  Administered 2012-09-01: 2 via RESPIRATORY_TRACT
  Filled 2012-09-01: qty 6.7

## 2012-09-01 MED ORDER — OXYMETAZOLINE HCL 0.05 % NA SOLN
1.0000 | Freq: Once | NASAL | Status: AC
  Administered 2012-09-01: 1 via NASAL
  Filled 2012-09-01: qty 15

## 2012-09-01 MED ORDER — AMOXICILLIN 500 MG PO CAPS: 500.0000 mg | ORAL_CAPSULE | Freq: Three times a day (TID) | ORAL | Status: DC

## 2012-09-01 MED ORDER — PREDNISONE 10 MG PO TABS: 20.0000 mg | ORAL_TABLET | Freq: Two times a day (BID) | ORAL | Status: DC

## 2012-09-01 NOTE — ED Provider Notes (Signed)
Medical screening examination/treatment/procedure(s) were performed by non-physician practitioner and as supervising physician I was immediately available for consultation/collaboration.  Geoffery Lyons, MD 09/01/12 2352

## 2012-09-01 NOTE — ED Provider Notes (Signed)
History     CSN: [id number]  Arrival date & time 09/01/12  1558   First MD Initiated Contact with Patient 09/01/12 1633      Chief Complaint  Patient presents with  . Epistaxis  . Headache    (Consider location/radiation/quality/duration/timing/severity/associated sxs/prior treatment) Patient is a 27 y.o. male presenting with headaches and URI.  Headache Associated symptoms: congestion, cough, ear pain, facial pain, myalgias, photophobia and URI   Associated symptoms: no fever, no sore throat and no swollen glands   URI Presenting symptoms: congestion, cough, ear pain, facial pain and rhinorrhea   Presenting symptoms: no fever and no sore throat   Severity:  Moderate Onset quality:  Gradual Duration:  2 weeks Timing:  Intermittent Progression:  Worsening Chronicity:  New Relieved by: used mother's inhailer and it helped. Associated symptoms: headaches (sinus area), myalgias, sinus pain, sneezing and wheezing   Associated symptoms: no swollen glands   Risk factors: sick contacts   Risk factors: no diabetes mellitus     Past Medical History  Diagnosis Date  . ADHD (attention deficit hyperactivity disorder)   . Acid reflux     Past Surgical History  Procedure Laterality Date  . Tympanoplasty    . Wisdom tooth extraction      Family History  Problem Relation Age of Onset  . Asthma Mother   . Migraines Mother   . Cancer Other   . Heart attack Other   . Alzheimer's disease Other     History  Substance Use Topics  . Smoking status: Current Every Day Smoker -- 1.00 packs/day for 10 years    Types: Cigarettes  . Smokeless tobacco: Former Neurosurgeon  . Alcohol Use: No      Review of Systems  Constitutional: Positive for chills. Negative for fever.  HENT: Positive for ear pain, nosebleeds, congestion, rhinorrhea and sneezing. Negative for sore throat.   Eyes: Positive for photophobia, redness and itching.  Respiratory: Positive for cough and wheezing.    Musculoskeletal: Positive for myalgias.  Neurological: Positive for headaches (sinus area).  Psychiatric/Behavioral: The patient is not nervous/anxious.     Allergies  Goodys body pain and Tramadol  Home Medications   Current Outpatient Rx  Name  Route  Sig  Dispense  Refill  . HYDROcodone-acetaminophen (NORCO/VICODIN) 5-325 MG per tablet   Oral   Take 1 tablet by mouth every 6 (six) hours as needed for pain.         Marland Kitchen HYDROcodone-acetaminophen (NORCO/VICODIN) 5-325 MG per tablet      Take one-two tabs po q 4-6 hrs prn pain   15 tablet   0   . ibuprofen (ADVIL,MOTRIN) 600 MG tablet   Oral   Take 1 tablet (600 mg total) by mouth every 6 (six) hours as needed for pain.   60 tablet   0   . metaxalone (SKELAXIN) 800 MG tablet   Oral   Take 1 tablet (800 mg total) by mouth 4 (four) times daily.   40 tablet   0   . sulfamethoxazole-trimethoprim (SEPTRA DS) 800-160 MG per tablet   Oral   Take 1 tablet by mouth 2 (two) times daily.   28 tablet   0   . traMADol (ULTRAM) 50 MG tablet   Oral   Take 2 tablets (100 mg total) by mouth every 6 (six) hours as needed for pain.   16 tablet   0     BP 132/82  Pulse 75  Temp(Src) 98.4 F (36.9  C) (Oral)  Resp 20  Ht 5\' 8"  (1.727 m)  Wt 232 lb (105.235 kg)  BMI 35.28 kg/m2  SpO2 100%  Physical Exam  Nursing note and vitals reviewed. Constitutional: He is oriented to person, place, and time. He appears well-developed and well-nourished. No distress.  HENT:  Head: Normocephalic.  Right Ear: Tympanic membrane is scarred.  Left Ear: Tympanic membrane normal.  Nose: Mucosal edema and sinus tenderness present. No nasal deformity. No epistaxis. Left sinus exhibits maxillary sinus tenderness and frontal sinus tenderness.  Mouth/Throat: Uvula is midline, oropharynx is clear and moist and mucous membranes are normal.  Nasal mucosa swollen and erythematous, no active bleeding.  Eyes: EOM are normal. Pupils are equal, round,  and reactive to light. Right conjunctiva is injected. Left conjunctiva is injected.  Neck: Neck supple.  Cardiovascular: Normal rate and regular rhythm.   Pulmonary/Chest: Effort normal. He has wheezes.  Abdominal: Soft. There is no tenderness.  Musculoskeletal: Normal range of motion. He exhibits no edema.  Neurological: He is alert and oriented to person, place, and time. No cranial nerve deficit.  Skin: Skin is warm and dry.  Psychiatric: He has a normal mood and affect. His behavior is normal. Judgment and thought content normal.    ED Course  Procedures (including critical care time) Assessment: 27 y.o. male with cough, wheezing, sinus congestion and nose bleed   Bronchitis, Sinusitis, epistaxis   Plan:  Albuterol inhaler   Antibiotics   Afrin nasal spray   Robitussin Exp.    Prednisone  MDM  I have reviewed this patient's vital signs, nurses notes and discussed clinical findings with the patient. We discussed smoking and he states he has cut back for 1 and a half packs a day to 1 pack. We discussed only using the Afrin Nasal spray x 3 days to prevent rebound. We discussed need for follow up with a PCP. Patient voices understanding.   Medication List    TAKE these medications       amoxicillin 500 MG capsule  Commonly known as:  AMOXIL  Take 1 capsule (500 mg total) by mouth 3 (three) times daily.     predniSONE 10 MG tablet  Commonly known as:  DELTASONE  Take 2 tablets (20 mg total) by mouth 2 (two) times daily.      ASK your doctor about these medications       HYDROcodone-acetaminophen 5-325 MG per tablet  Commonly known as:  NORCO/VICODIN  Take one-two tabs po q 4-6 hrs prn pain     HYDROcodone-acetaminophen 5-325 MG per tablet  Commonly known as:  NORCO/VICODIN  Take 1 tablet by mouth every 6 (six) hours as needed for pain.     ibuprofen 600 MG tablet  Commonly known as:  ADVIL,MOTRIN  Take 1 tablet (600 mg total) by mouth every 6 (six) hours as needed for  pain.     metaxalone 800 MG tablet  Commonly known as:  SKELAXIN  Take 1 tablet (800 mg total) by mouth 4 (four) times daily.     sulfamethoxazole-trimethoprim 800-160 MG per tablet  Commonly known as:  SEPTRA DS  Take 1 tablet by mouth 2 (two) times daily.     traMADol 50 MG tablet  Commonly known as:  ULTRAM  Take 2 tablets (100 mg total) by mouth every 6 (six) hours as needed for pain.               [facility] Orlene Och, [place] 09/01/12 [phone]

## 2012-09-01 NOTE — ED Notes (Signed)
Pt woke this am and had a "large clot come out of his nose", was fine all day then was in the shower this afternoon and saw a trickle of blood.  No bleeding at present. Has had a headache all day. Has taken 3 tylenol all day and has not helped his pain.  Also has right eye "squeaking for years" wants to know if it is causing it"

## 2012-10-21 ENCOUNTER — Encounter (HOSPITAL_COMMUNITY): Payer: Self-pay | Admitting: *Deleted

## 2012-10-21 ENCOUNTER — Emergency Department (HOSPITAL_COMMUNITY)
Admission: EM | Admit: 2012-10-21 | Discharge: 2012-10-21 | Disposition: A | Discharge status: Home / Self Care | Payer: Self-pay | Attending: Emergency Medicine | Admitting: Emergency Medicine

## 2012-10-21 DIAGNOSIS — H5710 Ocular pain, unspecified eye: Secondary | ICD-10-CM | POA: Insufficient documentation

## 2012-10-21 DIAGNOSIS — Z8719 Personal history of other diseases of the digestive system: Secondary | ICD-10-CM | POA: Insufficient documentation

## 2012-10-21 DIAGNOSIS — H538 Other visual disturbances: Secondary | ICD-10-CM | POA: Insufficient documentation

## 2012-10-21 DIAGNOSIS — T550X1A Toxic effect of soaps, accidental (unintentional), initial encounter: Secondary | ICD-10-CM | POA: Insufficient documentation

## 2012-10-21 DIAGNOSIS — R51 Headache: Secondary | ICD-10-CM | POA: Insufficient documentation

## 2012-10-21 DIAGNOSIS — Y9289 Other specified places as the place of occurrence of the external cause: Secondary | ICD-10-CM | POA: Insufficient documentation

## 2012-10-21 DIAGNOSIS — Y9389 Activity, other specified: Secondary | ICD-10-CM | POA: Insufficient documentation

## 2012-10-21 DIAGNOSIS — T2692XA Corrosion of left eye and adnexa, part unspecified, initial encounter: Secondary | ICD-10-CM

## 2012-10-21 DIAGNOSIS — Z8659 Personal history of other mental and behavioral disorders: Secondary | ICD-10-CM | POA: Insufficient documentation

## 2012-10-21 DIAGNOSIS — H5789 Other specified disorders of eye and adnexa: Secondary | ICD-10-CM | POA: Insufficient documentation

## 2012-10-21 DIAGNOSIS — Y990 Civilian activity done for income or pay: Secondary | ICD-10-CM | POA: Insufficient documentation

## 2012-10-21 MED ORDER — HYDROCODONE-ACETAMINOPHEN 5-325 MG PO TABS
1.0000 | ORAL_TABLET | Freq: Once | ORAL | Status: AC
  Administered 2012-10-21: 1 via ORAL
  Filled 2012-10-21: qty 1

## 2012-10-21 MED ORDER — HYDROCODONE-ACETAMINOPHEN 5-325 MG PO TABS: 1.0000 | ORAL_TABLET | Freq: Four times a day (QID) | ORAL | Status: DC | PRN

## 2012-10-21 MED ORDER — FLUORESCEIN SODIUM 1 MG OP STRP
ORAL_STRIP | OPHTHALMIC | Status: AC
  Administered 2012-10-21: 22:00:00
  Filled 2012-10-21: qty 1

## 2012-10-21 NOTE — ED Notes (Signed)
Pt had bleach get into left eye, immediately flushed with water, blurry vision to left eye now

## 2012-10-21 NOTE — ED Provider Notes (Signed)
History  This chart was scribed for Dustin Jakes, MD, by Dustin Rhodes, ED Scribe. This patient was seen in room [facility]17[facility] and the patient's care was started at 9:22 PM   CSN: [id number] Arrival date & time 10/21/12  1858  First MD Initiated Contact with Patient 10/21/12 1947     Chief Complaint  Patient presents with  . Eye Problem    The history is provided by the patient and medical records. No language interpreter was used.   HPI Comments: Dustin Rhodes is a 27 y.o. 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  He is experiencing left eye redness, left eye discharge, and blurred vision.  He describes the pain as 6/10.  Pt reports he washed the eye out with water immediately after several times for 5-10 minutes each time.  Pt does not wear contacts. He denies getting bleach in mouth.  Pt has taken tylenol with no relief.    Past Medical History  Diagnosis Date  . ADHD (attention deficit hyperactivity disorder)   . Acid reflux    Past Surgical History  Procedure Laterality Date  . Tympanoplasty    . Wisdom tooth extraction     Family History  Problem Relation Age of Onset  . Asthma Mother   . Migraines Mother   . Cancer Other   . Heart attack Other   . Alzheimer's disease Other    History  Substance Use Topics  . Smoking status: Current Every Day Smoker -- 1.00 packs/day for 10 years    Types: Cigarettes  . Smokeless tobacco: Former Neurosurgeon  . Alcohol Use: No    Review of Systems  Constitutional: Negative for fever and chills.  HENT: Negative for congestion, rhinorrhea and neck pain.   Eyes: Positive for pain (left eye), discharge (left eye), redness (left eye) and visual disturbance (blurred vision).  Respiratory: Negative for shortness of breath.   Cardiovascular: Negative for chest pain and leg swelling.   Gastrointestinal: Negative for nausea, vomiting, abdominal pain and diarrhea.  Genitourinary: Negative for dysuria.  Musculoskeletal: Negative for back pain.  Skin: Negative for rash.  Neurological: Positive for headaches.  Hematological: Does not bruise/bleed easily.  Psychiatric/Behavioral: Negative for confusion.    Allergies  Goodys body pain and Tramadol  Home Medications   Current Outpatient Rx  Name  Route  Sig  Dispense  Refill  . ibuprofen (ADVIL,MOTRIN) 200 MG tablet   Oral   Take 800 mg by mouth every 6 (six) hours as needed for pain.         Marland Kitchen HYDROcodone-acetaminophen (NORCO/VICODIN) 5-325 MG per tablet   Oral   Take 1-2 tablets by mouth every 6 (six) hours as needed for pain.   15 tablet   0    BP 132/84  Pulse 91  Temp(Src) 98.2 F (36.8 C) (Oral)  Resp 18  Ht 5\' 8"  (1.727 m)  Wt 226 lb (102.513 kg)  BMI 34.37 kg/m2  SpO2 98% Physical Exam  Nursing note and vitals reviewed. Constitutional: He is oriented to person, place, and time. He appears well-developed and well-nourished. No distress.  HENT:  Head: Normocephalic and atraumatic.  Eyes: EOM are normal. Pupils are equal, round, and reactive to light.  Left eye redness.  Normal anterior chamber of left eye.  No uptake of dye in cornea.    Neck:  Normal range of motion.  Cardiovascular: Normal rate and regular rhythm.   No murmur heard. Pulmonary/Chest: Effort normal. No respiratory distress. He has no wheezes. He has no rales.  Musculoskeletal: Normal range of motion. He exhibits no edema.  Neurological: He is alert and oriented to person, place, and time. No cranial nerve deficit. Coordination normal.  Skin: Skin is warm and dry. He is not diaphoretic.  Psychiatric: He has a normal mood and affect. His behavior is normal.    ED Course  Procedures  DIAGNOSTIC STUDIES: Oxygen Saturation is 98% on room air, normal by my interpretation.    COORDINATION OF CARE:  8:19 PM Discussed course of  care with pt which includes continued use of motrin.  Will further examine eye with fluorescin uptake.  Pt understands and agrees.    9:23 PM Examination with fluorescin uptake. Performed by Dustin Jakes, MD  Labs Reviewed - No data to display No results found. 1. Chemical insult, eye, left, initial encounter     MDM   Patient with bleach splashed in left eye. For seen staining and black light without any evidence of corneal injury. Patient improved. Patient also flushed I. significantly at the scene which was very helpful. Patient does not wear contacts. Patient will continue on Motrin and supplement with hydrocodone as needed. Return for any new or worse symptoms. Expect full recovery.     I personally performed the services described in this documentation, which was scribed in my presence. The recorded information has been reviewed and is accurate.     Dustin Jakes, MD 10/21/12 2150

## 2012-12-20 ENCOUNTER — Encounter (HOSPITAL_COMMUNITY): Payer: Self-pay | Admitting: Emergency Medicine

## 2012-12-20 ENCOUNTER — Emergency Department (HOSPITAL_COMMUNITY)
Admission: EM | Admit: 2012-12-20 | Discharge: 2012-12-20 | Disposition: A | Discharge status: Home / Self Care | Payer: Self-pay | Attending: Emergency Medicine | Admitting: Emergency Medicine

## 2012-12-20 DIAGNOSIS — F17200 Nicotine dependence, unspecified, uncomplicated: Secondary | ICD-10-CM | POA: Insufficient documentation

## 2012-12-20 DIAGNOSIS — Z8659 Personal history of other mental and behavioral disorders: Secondary | ICD-10-CM | POA: Insufficient documentation

## 2012-12-20 DIAGNOSIS — K0889 Other specified disorders of teeth and supporting structures: Secondary | ICD-10-CM

## 2012-12-20 DIAGNOSIS — Z8719 Personal history of other diseases of the digestive system: Secondary | ICD-10-CM | POA: Insufficient documentation

## 2012-12-20 DIAGNOSIS — R110 Nausea: Secondary | ICD-10-CM | POA: Insufficient documentation

## 2012-12-20 DIAGNOSIS — K089 Disorder of teeth and supporting structures, unspecified: Secondary | ICD-10-CM | POA: Insufficient documentation

## 2012-12-20 MED ORDER — PENICILLIN V POTASSIUM 500 MG PO TABS: 500.0000 mg | ORAL_TABLET | Freq: Four times a day (QID) | ORAL | Status: AC

## 2012-12-20 MED ORDER — IBUPROFEN 800 MG PO TABS
800.0000 mg | ORAL_TABLET | Freq: Once | ORAL | Status: AC
  Administered 2012-12-20: 800 mg via ORAL
  Filled 2012-12-20: qty 1

## 2012-12-20 MED ORDER — PENICILLIN V POTASSIUM 500 MG PO TABS: 500.0000 mg | ORAL_TABLET | Freq: Four times a day (QID) | ORAL | Status: DC

## 2012-12-20 NOTE — ED Provider Notes (Signed)
CSN: [id number]     Arrival date & time 12/20/12  0142 History   First MD Initiated Contact with Patient 12/20/12 0205     Chief Complaint  Patient presents with  . Dental Problem   Patient is a 27 y.o. male presenting with tooth pain. The history is provided by the patient.  Dental Pain Location:  Lower Severity:  Moderate Onset quality:  Gradual Timing:  Constant Progression:  Worsening Chronicity:  Recurrent Relieved by:  Nothing Associated symptoms: no fever     Past Medical History  Diagnosis Date  . ADHD (attention deficit hyperactivity disorder)   . Acid reflux    Past Surgical History  Procedure Laterality Date  . Tympanoplasty    . Wisdom tooth extraction     Family History  Problem Relation Age of Onset  . Asthma Mother   . Migraines Mother   . Cancer Other   . Heart attack Other   . Alzheimer's disease Other    History  Substance Use Topics  . Smoking status: Current Every Day Smoker -- 1.00 packs/day for 10 years    Types: Cigarettes  . Smokeless tobacco: Former Neurosurgeon  . Alcohol Use: No    Review of Systems  Constitutional: Negative for fever.  Gastrointestinal: Positive for nausea.    Allergies  Goodys body pain and Tramadol  Home Medications   Current Outpatient Rx  Name  Route  Sig  Dispense  Refill  . ibuprofen (ADVIL,MOTRIN) 200 MG tablet   Oral   Take 800 mg by mouth every 6 (six) hours as needed for pain.         Marland Kitchen HYDROcodone-acetaminophen (NORCO/VICODIN) 5-325 MG per tablet   Oral   Take 1-2 tablets by mouth every 6 (six) hours as needed for pain.   15 tablet   0   . penicillin v potassium (VEETID) 500 MG tablet   Oral   Take 1 tablet (500 mg total) by mouth 4 (four) times daily.   40 tablet   0    BP 129/81  Pulse 73  Temp(Src) 97.9 F (36.6 C) (Oral)  Resp 18  Ht 5\' 8"  (1.727 m)  Wt 230 lb (104.327 kg)  BMI 34.98 kg/m2  SpO2 97% Physical Exam CONSTITUTIONAL: Well developed/well nourished HEAD AND FACE:  Normocephalic/atraumatic EYES: EOMI/PERRL ENMT: Mucous membranes moist.  Poor dentition.  No trismus.  No focal abscess noted. NECK: supple no meningeal signs CV: S1/S2 noted, no murmurs/rubs/gallops noted LUNGS: Lungs are clear to auscultation bilaterally, no apparent distress ABDOMEN: soft, nontender, no rebound or guarding NEURO: Pt is awake/alert, moves all extremitiesx4 EXTREMITIES:full ROM SKIN: warm, color normal  ED Course  Procedures (including critical care time)  MDM   1. Pain, dental    Nursing notes including past medical history and social history reviewed and considered in documentation     Joya Gaskins, MD 12/20/12 [phone]

## 2012-12-20 NOTE — ED Notes (Signed)
Patient states a chunk of his tooth broke off yesterday.  Patient c/o increased pain, swelling to left lower.  Patient c/o "knot" to under chin.

## 2013-05-01 ENCOUNTER — Encounter (HOSPITAL_COMMUNITY): Payer: Self-pay | Admitting: Emergency Medicine

## 2013-05-01 DIAGNOSIS — K089 Disorder of teeth and supporting structures, unspecified: Secondary | ICD-10-CM | POA: Insufficient documentation

## 2013-05-01 DIAGNOSIS — K029 Dental caries, unspecified: Secondary | ICD-10-CM | POA: Insufficient documentation

## 2013-05-01 DIAGNOSIS — Z8659 Personal history of other mental and behavioral disorders: Secondary | ICD-10-CM | POA: Insufficient documentation

## 2013-05-01 DIAGNOSIS — R51 Headache: Secondary | ICD-10-CM | POA: Insufficient documentation

## 2013-05-01 DIAGNOSIS — F17200 Nicotine dependence, unspecified, uncomplicated: Secondary | ICD-10-CM | POA: Insufficient documentation

## 2013-05-01 NOTE — ED Notes (Signed)
Pt c/o dental pain after breaking tooth.

## 2013-05-02 ENCOUNTER — Emergency Department (HOSPITAL_COMMUNITY)
Admission: EM | Admit: 2013-05-02 | Discharge: 2013-05-02 | Disposition: A | Discharge status: Home / Self Care | Payer: Self-pay | Attending: Emergency Medicine | Admitting: Emergency Medicine

## 2013-05-02 DIAGNOSIS — K0889 Other specified disorders of teeth and supporting structures: Secondary | ICD-10-CM

## 2013-05-02 MED ORDER — CLINDAMYCIN HCL 150 MG PO CAPS: 300.0000 mg | ORAL_CAPSULE | Freq: Four times a day (QID) | ORAL | Status: DC

## 2013-05-02 MED ORDER — DICLOFENAC SODIUM 75 MG PO TBEC: 75.0000 mg | DELAYED_RELEASE_TABLET | Freq: Two times a day (BID) | ORAL | Status: DC

## 2013-05-02 NOTE — Discharge Instructions (Signed)
Dental Pain °Toothache is pain in or around a tooth. It may get worse with chewing or with cold or heat.  °HOME CARE °· Your dentist may use a numbing medicine during treatment. If so, you may need to avoid eating until the medicine wears off. Ask your dentist about this. °· Only take medicine as told by your dentist or doctor. °· Avoid chewing food near the painful tooth until after all treatment is done. Ask your dentist about this. °GET HELP RIGHT AWAY IF:  °· The problem gets worse or new problems appear. °· You have a fever. °· There is redness and puffiness (swelling) of the face, jaw, or neck. °· You cannot open your mouth. °· There is pain in the jaw. °· There is very bad pain that is not helped by medicine. °MAKE SURE YOU:  °· Understand these instructions. °· Will watch your condition. °· Will get help right away if you are not doing well or get worse. °Document Released: 09/21/2007 Document Revised: 06/27/2011 Document Reviewed: 09/21/2007 °[employer]® Patient Information ©2014 [employer]. ° °

## 2013-05-02 NOTE — ED Provider Notes (Signed)
Medical screening examination/treatment/procedure(s) were performed by non-physician practitioner and as supervising physician I was immediately available for consultation/collaboration.  EKG Interpretation   None        Sunnie NielsenBrian Geral Coker, MD 05/02/13 [phone]

## 2013-05-02 NOTE — ED Provider Notes (Signed)
CSN: [id number]     Arrival date & time 05/01/13  2253 History   First MD Initiated Contact with Patient 05/01/13 2304     Chief Complaint  Patient presents with  . Dental Pain   (Consider location/radiation/quality/duration/timing/severity/associated sxs/prior Treatment) Patient is a 28 y.o. male presenting with tooth pain. The history is provided by the patient.  Dental Pain Location:  Lower Lower teeth location:  18/LL 2nd molar Quality:  Throbbing and localized Severity:  Moderate Onset quality:  Gradual Duration:  2 days Timing:  Constant Progression:  Unchanged Chronicity:  New Context: dental caries, dental fracture and poor dentition   Context: not recent dental surgery and not trauma   Relieved by:  Nothing Worsened by:  Cold food/drink Ineffective treatments:  NSAIDs Associated symptoms: facial pain and gum swelling   Associated symptoms: no congestion, no difficulty swallowing, no drooling, no facial swelling, no fever, no headaches, no neck pain, no neck swelling, no oral bleeding, no oral lesions and no trismus   Risk factors: lack of dental care, periodontal disease and smoking     Past Medical History  Diagnosis Date  . ADHD (attention deficit hyperactivity disorder)   . Acid reflux    Past Surgical History  Procedure Laterality Date  . Tympanoplasty    . Wisdom tooth extraction     Family History  Problem Relation Age of Onset  . Asthma Mother   . Migraines Mother   . Cancer Other   . Heart attack Other   . Alzheimer's disease Other    History  Substance Use Topics  . Smoking status: Current Every Day Smoker -- 1.00 packs/day for 10 years    Types: Cigarettes  . Smokeless tobacco: Former Neurosurgeon  . Alcohol Use: No    Review of Systems  Constitutional: Negative for fever and appetite change.  HENT: Positive for dental problem. Negative for congestion, drooling, facial swelling, mouth sores, sore throat and trouble swallowing.   Eyes: Negative for  pain and visual disturbance.  Musculoskeletal: Negative for neck pain and neck stiffness.  Neurological: Negative for dizziness, facial asymmetry and headaches.  Hematological: Negative for adenopathy.  All other systems reviewed and are negative.    Allergies  Goodys body pain and Tramadol  Home Medications   Current Outpatient Rx  Name  Route  Sig  Dispense  Refill  . HYDROcodone-acetaminophen (NORCO/VICODIN) 5-325 MG per tablet   Oral   Take 1-2 tablets by mouth every 6 (six) hours as needed for pain.   15 tablet   0   . ibuprofen (ADVIL,MOTRIN) 200 MG tablet   Oral   Take 800 mg by mouth every 6 (six) hours as needed for pain.          BP 144/90  Pulse 98  Temp(Src) 97.9 F (36.6 C) (Oral)  Resp 24  Ht 5\' 8"  (1.727 m)  Wt 235 lb (106.595 kg)  BMI 35.74 kg/m2  SpO2 97% Physical Exam  Nursing note and vitals reviewed. Constitutional: He is oriented to person, place, and time. He appears well-developed and well-nourished. No distress.  HENT:  Head: Normocephalic and atraumatic.  Right Ear: Tympanic membrane and ear canal normal.  Left Ear: Tympanic membrane and ear canal normal.  Mouth/Throat: Uvula is midline, oropharynx is clear and moist and mucous membranes are normal. No trismus in the jaw. Dental caries present. No dental abscesses or uvula swelling.  Dental caries of the lower left second molar.  No facial swelling, obvious dental abscess,  trismus, or sublingual abnml.    Neck: Normal range of motion. Neck supple.  Cardiovascular: Normal rate, regular rhythm, normal heart sounds and intact distal pulses.   No murmur heard. Pulmonary/Chest: Effort normal and breath sounds normal.  Musculoskeletal: Normal range of motion.  Lymphadenopathy:    He has no cervical adenopathy.  Neurological: He is alert and oriented to person, place, and time. He exhibits normal muscle tone. Coordination normal.  Skin: Skin is warm and dry.    ED Course  Procedures  (including critical care time) Labs Review Labs Reviewed - No data to display Imaging Review No results found.  EKG Interpretation   None       MDM    Patient has dental caries of the left lower second molar with slight purulent drainage along the surrounding gums.  No drain able abscess seen.  No trismus or concerning symptoms for Ludwig's angina.  Patient agrees to warm water rinses and close dental f/u.  Referral info given, clindamycin and diclofenac for pain  Lilliana Turner L. Bobak Oguinn, PA-C 05/02/13 0028

## 2013-05-02 NOTE — ED Notes (Signed)
Patient evaluated by Iva Lento Triplett, PA-C.

## 2013-09-29 ENCOUNTER — Emergency Department (HOSPITAL_COMMUNITY)
Admission: EM | Admit: 2013-09-29 | Discharge: 2013-09-29 | Disposition: A | Discharge status: Home / Self Care | Payer: Self-pay | Attending: Emergency Medicine | Admitting: Emergency Medicine

## 2013-09-29 ENCOUNTER — Emergency Department (HOSPITAL_COMMUNITY): Payer: Self-pay

## 2013-09-29 ENCOUNTER — Encounter (HOSPITAL_COMMUNITY): Payer: Self-pay | Admitting: Emergency Medicine

## 2013-09-29 DIAGNOSIS — R109 Unspecified abdominal pain: Secondary | ICD-10-CM

## 2013-09-29 DIAGNOSIS — R1012 Left upper quadrant pain: Secondary | ICD-10-CM | POA: Insufficient documentation

## 2013-09-29 DIAGNOSIS — R350 Frequency of micturition: Secondary | ICD-10-CM | POA: Insufficient documentation

## 2013-09-29 DIAGNOSIS — Z8719 Personal history of other diseases of the digestive system: Secondary | ICD-10-CM | POA: Insufficient documentation

## 2013-09-29 DIAGNOSIS — F17200 Nicotine dependence, unspecified, uncomplicated: Secondary | ICD-10-CM | POA: Insufficient documentation

## 2013-09-29 DIAGNOSIS — R509 Fever, unspecified: Secondary | ICD-10-CM | POA: Insufficient documentation

## 2013-09-29 DIAGNOSIS — R110 Nausea: Secondary | ICD-10-CM | POA: Insufficient documentation

## 2013-09-29 DIAGNOSIS — Z8659 Personal history of other mental and behavioral disorders: Secondary | ICD-10-CM | POA: Insufficient documentation

## 2013-09-29 DIAGNOSIS — R61 Generalized hyperhidrosis: Secondary | ICD-10-CM | POA: Insufficient documentation

## 2013-09-29 LAB — URINALYSIS, ROUTINE W REFLEX MICROSCOPIC
BILIRUBIN URINE: NEGATIVE
Glucose, UA: NEGATIVE mg/dL
Ketones, ur: NEGATIVE mg/dL
Leukocytes, UA: NEGATIVE
NITRITE: NEGATIVE
PROTEIN: NEGATIVE mg/dL
SPECIFIC GRAVITY, URINE: 1.02 (ref 1.005–1.030)
UROBILINOGEN UA: 0.2 mg/dL (ref 0.0–1.0)
pH: 6 (ref 5.0–8.0)

## 2013-09-29 LAB — COMPREHENSIVE METABOLIC PANEL
ALBUMIN: 3.5 g/dL (ref 3.5–5.2)
ALK PHOS: 49 U/L (ref 39–117)
ALT: 25 U/L (ref 0–53)
AST: 23 U/L (ref 0–37)
BUN: 11 mg/dL (ref 6–23)
CALCIUM: 8.9 mg/dL (ref 8.4–10.5)
CO2: 23 mEq/L (ref 19–32)
Chloride: 101 mEq/L (ref 96–112)
Creatinine, Ser: 0.81 mg/dL (ref 0.50–1.35)
GFR calc non Af Amer: >90 mL/min (ref >90)
Glucose, Bld: 102 mg/dL — ABNORMAL HIGH (ref 70–99)
POTASSIUM: 4.1 meq/L (ref 3.7–5.3)
SODIUM: 136 meq/L — AB (ref 137–147)
TOTAL PROTEIN: 6.9 g/dL (ref 6.0–8.3)
Total Bilirubin: 0.6 mg/dL (ref 0.3–1.2)

## 2013-09-29 LAB — CBC WITH DIFFERENTIAL/PLATELET
BASOS ABS: 0 10*3/uL (ref 0.0–0.1)
BASOS PCT: 0 % (ref 0–1)
EOS ABS: 0.1 10*3/uL (ref 0.0–0.7)
EOS PCT: 2 % (ref 0–5)
HCT: 40.8 % (ref 39.0–52.0)
Hemoglobin: 14.7 g/dL (ref 13.0–17.0)
Lymphocytes Relative: 21 % (ref 12–46)
Lymphs Abs: 1.1 10*3/uL (ref 0.7–4.0)
MCH: 30.1 pg (ref 26.0–34.0)
MCHC: 36 g/dL (ref 30.0–36.0)
MCV: 83.6 fL (ref 78.0–100.0)
Monocytes Absolute: 0.4 10*3/uL (ref 0.1–1.0)
Monocytes Relative: 7 % (ref 3–12)
NEUTROS PCT: 70 % (ref 43–77)
Neutro Abs: 3.6 10*3/uL (ref 1.7–7.7)
PLATELETS: 159 10*3/uL (ref 150–400)
RBC: 4.88 MIL/uL (ref 4.22–5.81)
RDW: 12.7 % (ref 11.5–15.5)
WBC: 5.2 10*3/uL (ref 4.0–10.5)

## 2013-09-29 LAB — URINE MICROSCOPIC-ADD ON

## 2013-09-29 LAB — LIPASE, BLOOD: Lipase: 17 U/L (ref 11–59)

## 2013-09-29 MED ORDER — IBUPROFEN 800 MG PO TABS
800.0000 mg | ORAL_TABLET | Freq: Once | ORAL | Status: AC
  Administered 2013-09-29: 800 mg via ORAL
  Filled 2013-09-29: qty 1

## 2013-09-29 NOTE — ED Notes (Signed)
I have been having real bad stomach pain on the left side with nausea, no vomiting per pt. My body aches and I have been running a fever it was 102.4 today.

## 2013-09-29 NOTE — Discharge Instructions (Signed)
°Emergency Department Resource Guide °1) Find a Doctor and Pay Out of Pocket °Although you won't have to find out who is covered by your insurance plan, it is a good idea to ask around and get recommendations. You will then need to call the office and see if the doctor you have chosen will accept you as a new patient and what types of options they offer for patients who are self-pay. Some doctors offer discounts or will set up payment plans for their patients who do not have insurance, but you will need to ask so you aren't surprised when you get to your appointment. ° °2) Contact Your Local Health Department °Not all health departments have doctors that can see patients for sick visits, but many do, so it is worth a call to see if yours does. If you don't know where your local health department is, you can check in your phone book. The CDC also has a tool to help you locate your state's health department, and many state websites also have listings of all of their local health departments. ° °3) Find a Walk-in Clinic °If your illness is not likely to be very severe or complicated, you may want to try a walk in clinic. These are popping up all over the country in pharmacies, drugstores, and shopping centers. They're usually staffed by nurse practitioners or physician assistants that have been trained to treat common illnesses and complaints. They're usually fairly quick and inexpensive. However, if you have serious medical issues or chronic medical problems, these are probably not your best option. ° °No Primary Care Doctor: °- Call [facility] at  832-8000 - they can help you locate a primary care doctor that  accepts your insurance, provides certain services, etc. °- Physician Referral Service- 1-800-533-3463 ° °Chronic Pain Problems: °Organization         Address  Phone   Notes  °[facility]  (336) 297-2271 Patients need to be referred by their primary care doctor.  ° °Medication  Assistance: °Organization         Address  Phone   Notes  °[place] [facility] [address] °[place], [place] [address] (336) 641-8030 --Must be a resident of [place] °-- Must have NO insurance coverage whatsoever (no Medicaid/ Medicare, etc.) °-- The pt. MUST have a primary care doctor that directs their care regularly and follows them in the community °  °[facility]  (866) 331-1348   °[employer]  (888) 892-1162   ° °Agencies that provide inexpensive medical care: °Organization         Address  Phone   Notes  °[facility]  (336) 832-8035   °[facility]    (336) 832-7272   °[facility] [address] °[place], [place] [address] (336) 832-4777   °[facility] [address], °[place] (336) 271-4999   °[facility]    (336) 373-0678   °[facility]    (336) 272-1050   °[facility] ° [address], [place] Phone:  (336) 832-4444, Fax:  (336) 832-4440 Hours of Operation:  9 am - 6 pm, M-F.  Also accepts Medicaid/Medicare and self-pay.  °[facility] ° [address], [place] Phone: (336) 832-3150, Fax: (336) [phone]. Hours of Operation:  8:30 am - 5:30 pm, M-F.  Also accepts Medicaid and self-pay.  °[facility] [place] [address], [place] Phone: (336) 878-6027   °[facility] [address], [place], [place] (336)723-1848, Ext. 123 Mondays & Thursdays: 7-9 AM.  First 15 patients are seen on a first come, first serve basis. °  ° °Medicaid-accepting [place] Providers: ° °Organization         Address  Phone   Notes  °[facility] [address], [place] (336) 641-2100 Also accepts self-pay patients.  °[facility] [address], [place] ° (336) 856-9996   °[facility] [address], [place]  (336) 288-8857   °[facility] [address] [place] [address], [place] (336) 299-7000   °Veita Bland [address], [place]  ° (336) 373-1557 Only accepts [place] Access Medicaid patients after they have their name applied to their card.  ° °Self-Pay (no insurance) in [place]: ° °Organization         Address  Phone   Notes  °Sickle Cell Patients, [facility] [address], [place] (336) 832-1970   °[facility] [address], [place] (336) 832-4400   °[facility] ° [address], [place] (336) 992-4800   °[facility]/Dr. Osei-Bonsu ° [address] [place] [address], [place] or [address], [place] (336) 841-8500 Phone number for both [place] and [place] locations is the same.  °[facility] [address], [place] (336) 299-0000   °[facility] [address] [place] [address], [place] or [address] (336) 852-7530 °(336) 878-2260   °[facility] [address], [place] (336) 350-1642, phone; (336) 294-5005, fax Sees patients 1st and 3rd Saturday of every month.  Must not qualify for public or private insurance (i.e. Medicaid, Medicare, [place] Health Choice, Veterans' Benefits) • Household income should be no more than 200% of the poverty level •The clinic cannot treat you if you are pregnant or think you are pregnant • Sexually transmitted diseases are not treated at the clinic.  ° ° °Dental Care: °Organization         Address  Phone  Notes  °[place] [facility] [address], [place] (336) 641-6152 Accepts children up to age 21 who are enrolled in Medicaid or [place] Health Choice; pregnant women with a Medicaid card; and children who have applied for Medicaid or [place] Health Choice, but were declined, whose parents can pay a reduced fee at time of service.  °[place]  [facility] [place]  [address], [place] (336) 641-7733 Accepts children up to age 21 who are enrolled in Medicaid or [place] Health Choice; pregnant women with a Medicaid card; and children who have applied for Medicaid or [place] Health Choice, but were declined, whose parents can pay a reduced fee at time of service.  °[facility] ° [address], [place] (336) 641-4533 Patients are seen by appointment only. Walk-ins are not accepted. [facility] will see patients 18 years of age and older. °Monday - Tuesday (8am-5pm) °Most Wednesdays (8:30-5pm) °$30 per visit, cash only  °[facility] ° [address], [place] (336) 641-4533 Patients are seen by appointment only. Walk-ins are not accepted. [facility] will see patients 18 years of age and older. °One   Wednesday Evening (Monthly: Volunteer Based).  $30 per visit, cash only  °[facility]  (919) 537-3737 for adults; Children under age 4, call Graduate Pediatric Dentistry at ([phone]) [phone]. Children aged 4-14, please call ([phone]) [phone] to request a pediatric application. ° Dental services are provided in all areas of dental care including fillings, crowns and bridges, complete and partial dentures, implants, gum treatment, root canals, and extractions. Preventive care is also provided. Treatment is provided to both adults and children. °Patients are selected via a lottery and there is often a waiting list. °  °[facility] [address], °[place] ° (336) 763-8833 [email or web address] °  °[facility] [address], [place], [place] (336)723-1848, Ext. 123 Second and Fourth Thursday of each month, opens at 6:30 AM; Clinic ends at 9 AM.  Patients are seen on a first-come first-served basis, and a limited number are seen during each clinic.  ° °[facility] ° [address], [place], [place] (336) 723-7904    Eligibility Requirements °You must have lived in [place], [place], or [place] counties for at least the last three months. °  You cannot be eligible for state or federal sponsored healthcare insurance, including Veterans Administration, Medicaid, or Medicare. °  You generally cannot be eligible for healthcare insurance through your employer.  °  How to apply: °Eligibility screenings are held every Tuesday and Wednesday afternoon from 1:00 pm until 4:00 pm. You do not need an appointment for the interview!  °[facility] [address], [place], [place] 336-631-2330   °[facility]  336-342-8273   °[facility]  336-703-3100   °[facility]  336-570-6415   ° °Behavioral Health Resources in the Community: °Intensive Outpatient Programs °Organization         Address  Phone  Notes  °[facility] [address], [place], [place] 336-878-6098   °[facility] [address], [place], [place] 336-832-9800   °[facility] [address], [place], [place] ° 336-882-2125   °[facility] [address],  °[place], [place] 1-800-853-5163 or 336-641-4981   °Substance Abuse Resources °Organization         Address  Phone  Notes  °[facility]  336-882-2125   °[facility]  336-784-9470   °[facility]  336-285-9073   °[facility]  336-845-3988   °Residential & Outpatient Substance Abuse Program  1-800-659-3381   °Psychological Services °Organization         Address  Phone  Notes  °[facility]  336- 832-9600   °[facility]  336- 378-7881   °[facility] [address], [place] 1-800-853-5163 or 336-641-4981   ° °Mobile Crisis Teams °Organization         Address  Phone  Notes  °[facility], Mobile Crisis Care Unit  1-877-626-1772   °Assertive °[facility] ° [address]  [place], [place] 336-834-9664   °Sharon DeEsch [address] °[place] 336-554-5454   ° °Self-Help/Support Groups °Organization         Address  Phone             Notes  °[facility] of  - variety of support groups  336- 373-1402 Call for more information  °Narcotics Anonymous (NA), [facility] [address], °[place]  2 meetings at this location  ° °  Residential Treatment Programs Organization         Address  Phone  Notes  [facility] [address],    [place]  [phone]   [facility]  [address], [place] [id number], [place], [place] [phone]   [facility] [address], [place] [phone] Admissions: 8am-3pm M-F  [facility] [address]    [place], [place] [phone]   [facility] [address], [place], [place] [phone]   [facility] [address]  [place], [place] [phone]   [facility] Intensive Outpatient [address], [place], [place] [phone]   [facility] [address]  [place], [place] [phone] or [phone]   [facility] ([facility]) [address] [place], [place] [phone](702) [phone] Accepts Medicaid  [facility] [address]  [place] [phone] Substance Abuse/Addiction Treatment   [facility] Organization         Address  Phone  Notes  [facility]  669-712-1825(888) 306-149-7369   Angie FavaJulie Brannon, PhD [address], [address] [place], [place]   [phone](336) 231-720-2449 or [phone](336) (864)[phone]   [facility]   [address] [place], [place] [phone](336) (817) [phone]   [facility] [address], [place], [place] [phone](336) [phone] Insurance/Medicaid/sponsorship through [facility] [address]                                    [place], [place] ([phone](336) [phone] Therapy/tele-psych/case    [facility] [address]   [place], [place] ([phone]) [phone](336) [phone]    Dr. Lolly MustacheArfeen  [phone](336) [phone]   [facility]  [facility] 1) [address], [place] 2) [address], [place] 3)  [address], [place] [phone](336) [phone](336) ([phone]  [phone](336) [phone]   [facility] Child [facility] (504)872-1834(336) 3864284925 or (703) 360-3033(336) (770) 593-1300 (After Hours)          Your labs and CT scan are ok today.  I suspect you have a viral infection.  Rest,  Make sure you are drinking plenty of fluids.  You may alternate tylenol and ibuprofen(motrin) taking the opposite medicine every 3 hours if you continue to have fever.  Return here for any new or worsened symptoms or if your symptoms last beyond the next 2 days.

## 2013-09-29 NOTE — ED Notes (Signed)
Pt c/o fever, chills, and LUQ abdominal pain since this morning.

## 2013-10-01 NOTE — ED Provider Notes (Signed)
CSN: [id number]     Arrival date & time 09/29/13  2006 History   First MD Initiated Contact with Patient 09/29/13 2008     Chief Complaint  Patient presents with  . Abdominal Pain     (Consider location/radiation/quality/duration/timing/severity/associated sxs/prior Treatment) Patient is a 28 y.o. male presenting with abdominal pain.  Abdominal Pain Associated symptoms: chills, fever and nausea   Associated symptoms: no chest pain, no constipation, no cough, no diarrhea, no dysuria, no hematuria, no shortness of breath, no sore throat and no vomiting     Dustin Rhodes is a 28 y.o. male presenting with a 1 day history of left flank and abdominal pain, nausea and fever to 102.4 prior to arrival here today.  He has taken iubprofen for fever reduction.  He denies vomiting, diarrhea, constipation and has had no hematuria or dysuria, but does endorse increased urinary frequency.  He has been able to maintain PO fluids but has had reduced appetite.  His abdominal pain is persistent, sharp and he has found no alleviators or activity that improves or worsens the pain.  His past medical and surgical history is noncontributory.  He has had no sick exposures.  He also denies any tick bites or rash.       Past Medical History  Diagnosis Date  . ADHD (attention deficit hyperactivity disorder)   . Acid reflux    Past Surgical History  Procedure Laterality Date  . Tympanoplasty    . Wisdom tooth extraction     Family History  Problem Relation Age of Onset  . Asthma Mother   . Migraines Mother   . Cancer Other   . Heart attack Other   . Alzheimer's disease Other    History  Substance Use Topics  . Smoking status: Current Every Day Smoker -- 1.00 packs/day for 10 years    Types: Cigarettes  . Smokeless tobacco: Former NeurosurgeonUser  . Alcohol Use: No    Review of Systems  Constitutional: Positive for fever and chills.  HENT: Negative for congestion and sore throat.   Eyes: Negative.    Respiratory: Negative for cough, chest tightness, shortness of breath and wheezing.   Cardiovascular: Negative for chest pain.  Gastrointestinal: Positive for nausea and abdominal pain. Negative for vomiting, diarrhea and constipation.  Genitourinary: Positive for frequency. Negative for dysuria, hematuria and penile pain.  Musculoskeletal: Negative for arthralgias, joint swelling and neck pain.  Skin: Negative.  Negative for rash and wound.  Neurological: Negative for dizziness, weakness, light-headedness, numbness and headaches.  Psychiatric/Behavioral: Negative.       Allergies  Goodys body pain and Tramadol  Home Medications   Prior to Admission medications   Medication Sig Start Date End Date Taking? Authorizing Provider  ibuprofen (ADVIL,MOTRIN) 200 MG tablet Take 800 mg by mouth every 6 (six) hours as needed for pain.   Yes Historical Provider, MD   BP 137/85  Pulse 107  Temp(Src) 101 F (38.3 C) (Oral)  Resp 18  Ht 5\' 8"  (1.727 m)  Wt 237 lb (107.502 kg)  BMI 36.04 kg/m2  SpO2 97% Physical Exam  Nursing note and vitals reviewed. Constitutional: He appears well-developed and well-nourished.  HENT:  Head: Normocephalic and atraumatic.  Mouth/Throat: Oropharynx is clear and moist.  Eyes: Conjunctivae are normal.  Neck: Normal range of motion.  Cardiovascular: Normal rate, regular rhythm, normal heart sounds and intact distal pulses.   Pulmonary/Chest: Effort normal and breath sounds normal. He has no wheezes.  Abdominal: Soft. Bowel  sounds are normal. There is no hepatosplenomegaly. There is tenderness in the left upper quadrant. There is CVA tenderness. There is no rigidity, no rebound and no guarding.  Musculoskeletal: Normal range of motion.  Neurological: He is alert.  Skin: Skin is warm. He is diaphoretic.  Psychiatric: He has a normal mood and affect.    ED Course  Procedures (including critical care time) Labs Review Labs Reviewed  URINALYSIS, ROUTINE  W REFLEX MICROSCOPIC - Abnormal; Notable for the following:    Hgb urine dipstick TRACE (*)    All other components within normal limits  COMPREHENSIVE METABOLIC PANEL - Abnormal; Notable for the following:    Sodium 136 (*)    Glucose, Bld 102 (*)    All other components within normal limits  CBC WITH DIFFERENTIAL  LIPASE, BLOOD  URINE MICROSCOPIC-ADD ON    Imaging Review Ct Abdomen Pelvis Wo Contrast  09/29/2013   CLINICAL DATA:  Fever.  Chills.  Left upper quadrant abdominal pain.  EXAM: CT ABDOMEN AND PELVIS WITHOUT CONTRAST  TECHNIQUE: Multidetector CT imaging of the abdomen and pelvis was performed following the standard protocol without IV contrast.  COMPARISON:  01/03/2011.  FINDINGS: Bones:  Normal.  Lung Bases: Normal.  Liver: Normal. Unenhanced CT was performed per clinician order. Lack of IV contrast limits sensitivity and specificity, especially for evaluation of abdominal/pelvic solid viscera.  Spleen:  Normal.  Gallbladder:  Contracted.  Common bile duct:  Normal.  Pancreas:  Normal.  Adrenal glands:  Normal.  Kidneys: Normal. No renal calculi. Both ureters are within normal limits.  Stomach:  Decompressed.  Small bowel:  Normal.  No mesenteric adenopathy.  No obstruction.  Colon:   Normal appendix.  Colon appears within normal limits.  Pelvic Genitourinary:  Normal urinary bladder.  No free fluid.  Vasculature: Normal.  Body Wall: Normal.  IMPRESSION: Negative CT urogram.   Electronically Signed   By: Andreas NewportGeoffrey  Lamke M.D.   On: 09/29/2013 22:11     EKG Interpretation None      MDM   Final diagnoses:  Abdominal pain  Febrile illness    Acute suspected viral illness with normal labs and CT which helped to rule out pyelo/kidney stones and pancreatic source of sx.  Pt's sx and results discussed with Dr. Judd Lienelo prior to dc home.  He was encouraged rest,  Increased fluid intake, he tolerated PO fluids while here with no return of sx.  Advised continued tylenol /motrin for fever  reduction and return here seek further eval if sx persist behond 48 hours or worsen. Pt and wife at bedside understand plan.   Burgess AmorJulie Idol, PA-C 10/01/13 1319

## 2013-10-02 NOTE — ED Provider Notes (Signed)
Medical screening examination/treatment/procedure(s) were performed by non-physician practitioner and as supervising physician I was immediately available for consultation/collaboration.     Douglas Delo, MD 10/02/13 1459 

## 2014-01-07 ENCOUNTER — Emergency Department (HOSPITAL_COMMUNITY)
Admission: EM | Admit: 2014-01-07 | Discharge: 2014-01-07 | Disposition: A | Discharge status: Home / Self Care | Payer: Self-pay | Attending: Emergency Medicine | Admitting: Emergency Medicine

## 2014-01-07 ENCOUNTER — Emergency Department (HOSPITAL_COMMUNITY): Payer: Self-pay

## 2014-01-07 ENCOUNTER — Encounter (HOSPITAL_COMMUNITY): Payer: Self-pay | Admitting: Emergency Medicine

## 2014-01-07 DIAGNOSIS — R109 Unspecified abdominal pain: Secondary | ICD-10-CM | POA: Insufficient documentation

## 2014-01-07 DIAGNOSIS — Z8659 Personal history of other mental and behavioral disorders: Secondary | ICD-10-CM | POA: Insufficient documentation

## 2014-01-07 DIAGNOSIS — D696 Thrombocytopenia, unspecified: Secondary | ICD-10-CM | POA: Insufficient documentation

## 2014-01-07 DIAGNOSIS — K625 Hemorrhage of anus and rectum: Secondary | ICD-10-CM | POA: Insufficient documentation

## 2014-01-07 DIAGNOSIS — K921 Melena: Secondary | ICD-10-CM | POA: Insufficient documentation

## 2014-01-07 DIAGNOSIS — F17200 Nicotine dependence, unspecified, uncomplicated: Secondary | ICD-10-CM | POA: Insufficient documentation

## 2014-01-07 DIAGNOSIS — Z79899 Other long term (current) drug therapy: Secondary | ICD-10-CM | POA: Insufficient documentation

## 2014-01-07 LAB — COMPREHENSIVE METABOLIC PANEL
ALT: 25 U/L (ref 0–53)
AST: 21 U/L (ref 0–37)
Albumin: 4 g/dL (ref 3.5–5.2)
Alkaline Phosphatase: 42 U/L (ref 39–117)
Anion gap: 10 (ref 5–15)
BILIRUBIN TOTAL: 0.4 mg/dL (ref 0.3–1.2)
BUN: 18 mg/dL (ref 6–23)
CHLORIDE: 104 meq/L (ref 96–112)
CO2: 24 meq/L (ref 19–32)
CREATININE: 0.74 mg/dL (ref 0.50–1.35)
Calcium: 9.2 mg/dL (ref 8.4–10.5)
GLUCOSE: 107 mg/dL — AB (ref 70–99)
Potassium: 4.8 mEq/L (ref 3.7–5.3)
Sodium: 138 mEq/L (ref 137–147)
Total Protein: 7.7 g/dL (ref 6.0–8.3)

## 2014-01-07 LAB — CBC
HEMATOCRIT: 44.2 % (ref 39.0–52.0)
HEMOGLOBIN: 15.7 g/dL (ref 13.0–17.0)
MCH: 29.8 pg (ref 26.0–34.0)
MCHC: 35.5 g/dL (ref 30.0–36.0)
MCV: 83.9 fL (ref 78.0–100.0)
PLATELETS: 80 10*3/uL — AB (ref 150–400)
RBC: 5.27 MIL/uL (ref 4.22–5.81)
RDW: 12.9 % (ref 11.5–15.5)
WBC: 8.4 10*3/uL (ref 4.0–10.5)

## 2014-01-07 LAB — LIPASE, BLOOD: LIPASE: 22 U/L (ref 11–59)

## 2014-01-07 LAB — POC OCCULT BLOOD, ED: Fecal Occult Bld: POSITIVE — AB

## 2014-01-07 MED ORDER — IOHEXOL 300 MG/ML  SOLN
50.0000 mL | Freq: Once | INTRAMUSCULAR | Status: AC | PRN
  Administered 2014-01-07: 50 mL via INTRAVENOUS

## 2014-01-07 MED ORDER — IOHEXOL 300 MG/ML  SOLN
100.0000 mL | Freq: Once | INTRAMUSCULAR | Status: AC | PRN
  Administered 2014-01-07: 100 mL via INTRAVENOUS

## 2014-01-07 MED ORDER — PANTOPRAZOLE SODIUM 40 MG IV SOLR
40.0000 mg | Freq: Once | INTRAVENOUS | Status: AC
  Administered 2014-01-07: 40 mg via INTRAVENOUS
  Filled 2014-01-07: qty 40

## 2014-01-07 NOTE — ED Notes (Signed)
Patient complaining of abdominal pain since awakening this morning and is experiencing blood in his stool.

## 2014-01-07 NOTE — ED Provider Notes (Signed)
CSN: [id number]     Arrival date & time 01/07/14  1039 History   First MD Initiated Contact with Patient 01/07/14 1110     Chief Complaint  Patient presents with  . Blood In Stools     (Consider location/radiation/quality/duration/timing/severity/associated sxs/prior Treatment) HPI Comments: Pt c/o abdominal pain in the upper and lower abdomen that started this morning. States that he had diarrhea which showed blood in the toilet and on the wipe. No history of similar symptoms. No vomiting. States that he doesn't drink.nothing makes pain better or worse. No abdominal surgeries. Took last ibuprofen a couple of weeks ago  The history is provided by the patient. No language interpreter was used.    Past Medical History  Diagnosis Date  . ADHD (attention deficit hyperactivity disorder)   . Acid reflux    Past Surgical History  Procedure Laterality Date  . Tympanoplasty    . Wisdom tooth extraction     Family History  Problem Relation Age of Onset  . Asthma Mother   . Migraines Mother   . Cancer Other   . Heart attack Other   . Alzheimer's disease Other    History  Substance Use Topics  . Smoking status: Current Every Day Smoker -- 1.00 packs/day for 10 years    Types: Cigarettes  . Smokeless tobacco: Former Neurosurgeon  . Alcohol Use: No    Review of Systems  Constitutional: Negative.   Respiratory: Negative.   Cardiovascular: Negative.       Allergies  Goodys body pain and Tramadol  Home Medications   Prior to Admission medications   Medication Sig Start Date End Date Taking? Authorizing Provider  Naproxen Sodium 220 MG CAPS Take 1 capsule by mouth daily as needed (pain).   Yes Historical Provider, MD  tetrahydrozoline 0.05 % ophthalmic solution Place 1 drop into both eyes daily.   Yes Historical Provider, MD   BP 124/98  Pulse 78  Temp(Src) 98.4 F (36.9 C) (Oral)  Resp 18  Ht  (1.727 m)  Wt 230 lb (104.327 kg)  BMI 34.98 kg/m2  SpO2 99% Physical Exam   Nursing note and vitals reviewed. Constitutional: He is oriented to person, place, and time. He appears well-developed and well-nourished.  Cardiovascular: Normal rate and regular rhythm.   Abdominal: Soft. Bowel sounds are normal. There is tenderness.  Left sided abdominal tenderness with palpation  Genitourinary: Guaiac positive stool.  Stool normal color  Musculoskeletal: Normal range of motion.  Neurological: He is alert and oriented to person, place, and time.  Skin: Skin is dry.  Psychiatric: He has a normal mood and affect.    ED Course  Procedures (including critical care time) Labs Review Labs Reviewed  CBC - Abnormal; Notable for the following:    Platelets 80 (*)    All other components within normal limits  COMPREHENSIVE METABOLIC PANEL - Abnormal; Notable for the following:    Glucose, Bld 107 (*)    All other components within normal limits  POC OCCULT BLOOD, ED - Abnormal; Notable for the following:    Fecal Occult Bld POSITIVE (*)    All other components within normal limits  LIPASE, BLOOD    Imaging Review Ct Abdomen Pelvis W Contrast  01/07/2014   CLINICAL DATA:  Left abdominal pain, blood in stool  EXAM: CT ABDOMEN AND PELVIS WITH CONTRAST  TECHNIQUE: Multidetector CT imaging of the abdomen and pelvis was performed using the standard protocol following bolus administration of intravenous contrast.  CONTRAST:  50mL OMNIPAQUE IOHEXOL 300 MG/ML SOLN, OMNIPAQUE IOHEXOL 300 MG/ML SOLN  COMPARISON:  09/29/2013  FINDINGS: Lung bases are unremarkable. Sagittal images of the spine are unremarkable. Enhanced liver, spleen, pancreas and adrenal glands are unremarkable. Enhanced kidneys are symmetrical in size. No aortic aneurysm. No hydronephrosis or hydroureter.  No small bowel obstruction. No ascites or free air. No adenopathy. Normal appendix. No pericecal inflammation. Unremarkable terminal ileum.  The urinary bladder is unremarkable. Prostate gland and seminal  vesicles are unremarkable. No distal colonic obstruction. No colitis or diverticulitis. No inguinal adenopathy. No destructive bony lesions are noted within pelvis.  IMPRESSION: 1. No acute inflammatory process within abdomen or pelvis. 2. No hydronephrosis or hydroureter. 3. Normal appendix.  No pericecal inflammation. 4. No colitis or diverticulitis.   Electronically Signed   By: Natasha Mead M.D.   On: 01/07/2014 12:48     EKG Interpretation None      MDM   Final diagnoses:  Thrombocytopenia  Rectal bleeding    Discussed the findings of low plts with pt. And the importance of follow up with heme/onc. Also with gi. Pt verbalized understanding. Discussed return precautions with pt   Teressa Lower, NP 01/07/14 1308

## 2014-01-07 NOTE — Discharge Instructions (Signed)
Bloody Stools Bloody stools means there is blood in your poop (stool). It is a sign that there is a problem somewhere in the digestive system. It is important for your doctor to find the cause of your bleeding, so the problem can be treated.  HOME CARE  Only take medicine as told by your doctor.  Eat foods with fiber (prunes, bran cereals).  Drink enough fluids to keep your pee (urine) clear or pale yellow.  Sit in warm water (sitz bath) for 10 to 15 minutes as told by your doctor.  Know how to take your medicines (enemas, suppositories) if advised by your doctor.  Watch for signs that you are getting better or getting worse. GET HELP RIGHT AWAY IF:   You are not getting better.  You start to get better but then get worse again.  You have new problems.  You have severe bleeding from the place where poop comes out (rectum) that does not stop.  You throw up (vomit) blood.  You feel weak or pass out (faint).  You have a fever. MAKE SURE YOU:   Understand these instructions.  Will watch your condition.  Will get help right away if you are not doing well or get worse. Document Released: 03/23/2009 Document Revised: 06/27/2011 Document Reviewed: 08/20/2010 [facility] Patient Information 2015 [place], [place]. This information is not intended to replace advice given to you by your health care provider. Make sure you discuss any questions you have with your health care provider.  Anticoagulation, Generic Anticoagulants are medicines used to prevent clots from developing in your veins. These medicine are also known as blood thinners. If blood clots are untreated, they could travel to your lungs. This is called a pulmonary embolus. A blood clot in your lungs can be fatal.  Health care providers often use anticoagulants to prevent clots following surgery. Anticoagulants are also used along with aspirin when the heart is not getting enough blood. Another anticoagulant called warfarin is  started 2 to 3 days after a rapid-acting injectable anticoagulant is started. The rapid-acting anticoagulants are usually continued until warfarin has begun to work. Your health care provider will judge this length of time by blood tests known as the prothrombin time (PT) and International Normalization Ratio (INR). This means that your blood is at the necessary and best level to prevent clots. RISKS AND COMPLICATIONS  If you have received recent epidural anesthesia, spinal anesthesia, or a spinal tap while receiving anticoagulants, you are at risk for developing a blood clot in or around the spine. This condition could result in long-term or permanent paralysis.  Because anticoagulants thin your blood, severe bleeding may occur from any tissue or organ. Symptoms of the blood being too thin may include:  Bleeding from the nose or gums that does not stop quickly.  Blood in bowel movements which may appear as bright red, dark, or black tarry stools.  Blood in the urine which may appear as pink, red, or brown urine.  Unusual bruising or bruising easily.  A cut that does not stop bleeding within 10 minutes.  Vomiting blood or continuous nausea for more than 1 day.  Coughing up blood.  Broken blood vessels in your eye (subconjunctival hemorrhage).  Abdominal or back pain with or without flank bruising.  Sudden, severe headache.  Sudden weakness or numbness of the face, arm, or leg, especially on one side of the body.  Sudden confusion.  Trouble speaking (aphasia) or understanding.  Sudden trouble seeing in one or  both eyes.  Sudden trouble walking.  Dizziness.  Loss of balance or coordination.  Vaginal bleeding.  Swelling or pain at an injection site.  Superficial fat tissue death (necrosis) which may cause skin scarring. This is more common in women and may first present as pain in the waist, thighs, or buttocks.  Fever.  Too little anticoagulation continues to allow the  risk for blood clots. HOME CARE INSTRUCTIONS   Due to the complications of anticoagulants, it is very important that you take your anticoagulant as directed by your health care provider. Anticoagulants need to be taken exactly as instructed. Be sure you understand all your anticoagulant instructions.  Keep all follow-up appointments with your health care provider as directed. It is very important to keep your appointments. Not keeping appointments could result in a chronic or permanent injury, pain, or disability.  Warfarin. Your health care provider will advise you on the length of treatment (usually 3-6 months, sometimes lifelong).  Take warfarin exactly as directed by your health care provider. It is recommended that you take your warfarin dose at the same time of the day. It is preferred that you take warfarin in the late afternoon. If you have been told to stop taking warfarin, do not resume taking warfarin until directed to do so by your health care provider. Follow your health care provider's instructions if you accidentally take an extra dose or miss a dose of warfarin. It is very important to take warfarin as directed since bleeding or blood clots could result in chronic or permanent injury, pain, or disability.  Too much and too little warfarin are both dangerous. Too much warfarin increases the risk of bleeding. Too little warfarin continues to allow the risk for blood clots. While taking warfarin, you will need to have regular blood tests to measure your blood clotting time. These blood tests usually include both the prothrombin time (PT) and International Normalized Ratio (INR) tests. The PT and INR results allow your health care provider to adjust your dose of warfarin. The dose can change for many reasons. It is critically important that you have your PT and INR levels drawn exactly as directed. Your warfarin dose may stay the same or change depending on what the PT and INR results are. Be  sure to follow up with your health care provider regarding your PT and INR test results and what your warfarin dosage should be.  Many medicines can interfere with warfarin and affect the PT and INR results. You must tell your health care provider about any and all medicines you take, this includes all vitamins and supplements. Ask your health care provider before taking these. Prescription and over-the-counter medicine consistency is critical to warfarin management. It is important that potential interactions are checked before you start a new medicine. Be especially cautious with aspirin and anti-inflammatory medicines. Ask your health care provider before taking these. Medicines such as antibiotics and acid-reducing medicine can interact with warfarin and can cause an increased warfarin effect. Warfarin can also interfere with the effectiveness of medicines you are taking. Do not take or discontinue any prescribed or over-the-counter medicine except on the advice of your health care provider or pharmacist.  Some vitamins, supplements, and herbal products interfere with the effectiveness of warfarin. Vitamin E may increase the anticoagulant effects of warfarin. Vitamin K may can cause warfarin to be less effective. Do not take or discontinue any vitamin, supplement, or herbal product except on the advice of your health care provider or  pharmacist.  Eat what you normally eat and keep the vitamin K content of your diet consistent. Avoid major changes in your diet, or notify your health care provider before changing your diet. Suddenly getting a lot more vitamin K could cause your blood to clot too quickly. A sudden decrease in vitamin K intake could cause your blood to clot too slowly. These changes in vitamin K intake could lead to dangerous blood clotsor to bleeding. To keep your vitamin K intake consistent, you must be aware of which foods contain moderate or high amounts of vitamin K. Some foods high in  vitamin K include spinach, kale, broccoli, cabbage, greens, Brussels sprouts, asparagus, [place], coleslaw, parsley, and green tea. Arrange a visit with a dietitian to answer your questions.  If you have a loss of appetite or get the stomach flu (viral gastroenteritis), talk to your health care provider as soon as possible. A decrease in your normal vitamin K intake can make you more sensitive to your usual dose of warfarin.  Some medical conditions may increase your risk for bleeding while you are taking warfarin. A fever, diarrhea lasting more than a day, worsening heart failure, or worsening liver function are some medical conditions that could affect warfarin. Contact your health care provider if you have any of these medical conditions.  Alcohol can change the body's ability to handle warfarin. It is best to avoid alcoholic drinks or consume only very small amounts while taking warfarin. Notify your health care provider if you change your alcohol intake. A sudden increase in alcohol use can increase your risk of bleeding. Chronic alcohol use can cause warfarin to be less effective.  Be careful not to cut yourself when using sharp objects or while shaving.  Inform all your health care providers and your dentist that you take an anticoagulant.  Limit physical activities or sports that could result in a fall or cause injury. Avoid contact sports.  Wear medical alert jewelry or carry a medical alert card. SEEK IMMEDIATE MEDICAL CARE IF:  You cough up blood.  You have dark or black stools or there is bright red blood coming from your rectum.  You vomit blood or have nausea for more than 1 day.  You have blood in the urine or pink colored urine.  You have unusual bruising or have increased bruising.  You have bleeding from the nose or gums that does not stop quickly.  You have a cut that does not stop bleeding within a 2-3 minutes.  You have sudden weakness or numbness of the face,  arm, or leg, especially on one side of the body.  You have sudden confusion.  You have trouble speaking (aphasia) or understanding.  You have sudden trouble seeing in one or both eyes.  You have sudden trouble walking.  You have dizziness.  You have a loss of balance or coordination.  You have a sudden, severe headache.  You have a serious fall or head injury, even if you are not bleeding.  You have swelling or pain at an injection site.  You have unexplained tenderness or pain in the abdomen, back, waist, thighs or buttocks.  You have a fever. Any of these symptoms may represent a serious problem that is an emergency. Do not wait to see if the symptoms will go away. Get medical help right away. Call your local emergency services (911 in [place] Do not drive yourself to the hospital. Document Released: 04/04/2005 Document Revised: 04/09/2013 Document Reviewed: 11/07/2007  [employer] Patient Information 2015 [place], [place]. This information is not intended to replace advice given to you by your health care provider. Make sure you discuss any questions you have with your health care provider.  Thrombocytopenia Thrombocytopenia is a condition in which there is an abnormally small number of platelets in your blood. Platelets are also called thrombocytes. Platelets are needed for blood clotting. CAUSES Thrombocytopenia is caused by:   Decreased production of platelets. This can be caused by:  Aplastic anemia in which your bone marrow quits making blood cells.  Cancer in the bone marrow.  Use of certain medicines, including chemotherapy.  Infection in the bone marrow.  Heavy alcohol consumption.  Increased destruction of platelets. This can be caused by:  Certain immune diseases.  Use of certain drugs.  Certain blood clotting disorders.  Certain inherited disorders.  Certain bleeding disorders.  Pregnancy.  Having an enlarged spleen (hypersplenism). In hypersplenism,  the spleen gathers up platelets from circulation. This means the platelets are not available to help with blood clotting. The spleen can enlarge due to cirrhosis or other conditions. SYMPTOMS  The symptoms of thrombocytopenia are side effects of poor blood clotting. Some of these are:  Abnormal bleeding.  Nosebleeds.  Heavy menstrual periods.  Blood in the urine or stools.  Purpura. This is a purplish discoloration in the skin produced by small bleeding vessels near the surface of the skin.  Bruising.  A rash that may be petechial. This looks like pinpoint, purplish-red spots on the skin and mucous membranes. It is caused by bleeding from small blood vessels (capillaries). DIAGNOSIS  Your caregiver will make this diagnosis based on your exam and blood tests. Sometimes, a bone marrow study is done to look for the original cells (megakaryocytes) that make platelets. TREATMENT  Treatment depends on the cause of the condition.  Medicines may be given to help protect your platelets from being destroyed.  In some cases, a replacement (transfusion) of platelets may be required to stop or prevent bleeding.  Sometimes, the spleen must be surgically removed. HOME CARE INSTRUCTIONS   Check the skin and linings inside your mouth for bruising or bleeding as directed by your caregiver.  Check your sputum, urine, and stool for blood as directed by your caregiver.  Do not return to any activities that could cause bumps or bruises until your caregiver says it is okay.  Take extra care not to cut yourself when shaving or when using scissors, needles, knives, and other tools.  Take extra care not to burn yourself when ironing or cooking.  Ask your caregiver if it is okay for you to drink alcohol.  Only take over-the-counter or prescription medicines as directed by your caregiver.  Notify all your caregivers, including dentists and eye doctors, about your condition. SEEK IMMEDIATE MEDICAL  CARE IF:   You develop active bleeding from anywhere in your body.  You develop unexplained bruising or bleeding.  You have blood in your sputum, urine, or stool. MAKE SURE YOU:  Understand these instructions.  Will watch your condition.  Will get help right away if you are not doing well or get worse. Document Released: 04/04/2005 Document Revised: 06/27/2011 Document Reviewed: 02/04/2011 [facility] Patient Information 2015 [place], [place]. This information is not intended to replace advice given to you by your health care provider. Make sure you discuss any questions you have with your health care provider.

## 2014-01-07 NOTE — ED Notes (Signed)
Pt states upper and lower abdominal pain. States he noticed blood when he had a BM. Blood is described as dark red. States he noticed only with the BM. Pt states he noticed blood in the stool and on toilet paper. NAD. Pt resting comfortably in bed.

## 2014-01-08 ENCOUNTER — Emergency Department (HOSPITAL_COMMUNITY)
Admission: EM | Admit: 2014-01-08 | Discharge: 2014-01-08 | Disposition: A | Discharge status: Home / Self Care | Payer: Self-pay | Attending: Emergency Medicine | Admitting: Emergency Medicine

## 2014-01-08 ENCOUNTER — Encounter (HOSPITAL_COMMUNITY): Payer: Self-pay | Admitting: Emergency Medicine

## 2014-01-08 DIAGNOSIS — R110 Nausea: Secondary | ICD-10-CM | POA: Insufficient documentation

## 2014-01-08 DIAGNOSIS — R5383 Other fatigue: Secondary | ICD-10-CM

## 2014-01-08 DIAGNOSIS — K920 Hematemesis: Secondary | ICD-10-CM | POA: Insufficient documentation

## 2014-01-08 DIAGNOSIS — K921 Melena: Secondary | ICD-10-CM | POA: Insufficient documentation

## 2014-01-08 DIAGNOSIS — Z8659 Personal history of other mental and behavioral disorders: Secondary | ICD-10-CM | POA: Insufficient documentation

## 2014-01-08 DIAGNOSIS — F17200 Nicotine dependence, unspecified, uncomplicated: Secondary | ICD-10-CM | POA: Insufficient documentation

## 2014-01-08 DIAGNOSIS — R109 Unspecified abdominal pain: Secondary | ICD-10-CM | POA: Insufficient documentation

## 2014-01-08 DIAGNOSIS — R5381 Other malaise: Secondary | ICD-10-CM | POA: Insufficient documentation

## 2014-01-08 MED ORDER — OMEPRAZOLE 20 MG PO CPDR: 20.0000 mg | DELAYED_RELEASE_CAPSULE | Freq: Two times a day (BID) | ORAL | Status: DC

## 2014-01-08 NOTE — ED Notes (Signed)
nad noted prior to dc. Dc instructions reviewed and explained. Voiced understanding to f/u with GI Dr. 1 Rx given to pt prior to dc home.

## 2014-01-08 NOTE — ED Provider Notes (Signed)
Medical screening examination/treatment/procedure(s) were performed by non-physician practitioner and as supervising physician I was immediately available for consultation/collaboration.   EKG Interpretation None        Sitlali Koerner L Joud Pettinato, MD 01/08/14 1548 

## 2014-01-08 NOTE — ED Notes (Signed)
Pt reports being seen yesterday for blood in stool, was treated and released. Pt reports he has continued having abd pain and at 0930 this AM he had reflux and there was blood in vomit. Pt reports he still has abd pain.

## 2014-01-08 NOTE — Discharge Instructions (Signed)
Prilosec 20 mg twice daily for the next 2 weeks.  Followup with gastroenterology. The contact information for Dr. Karilyn Cota has been provided in this discharge summary.   Hematemesis This condition is the vomiting of blood. CAUSES  This can happen if you have a peptic ulcer or an irritation of the throat, stomach, or small bowel. Vomiting over and over again or swallowing blood from a nosebleed, coughing or facial injury can also result in bloody vomit. Anti-inflammatory pain medicines are a common cause of this potentially dangerous condition. The most serious causes of vomiting blood include:  Ulcers (a bacteria called H. pylori is common cause of ulcers).  Clotting problems.  Alcoholism.  Cirrhosis. TREATMENT  Treatment depends on the cause and the severity of the bleeding. Small amounts of blood streaks in the vomit is not the same as vomiting large amounts of bloody or dark, coffee grounds-like material. Weakness, fainting, dehydration, anemia, and continued alcohol or drug use increase the risk. Examination may include blood, vomit, or stool tests. The presence of bloody or dark stool that tests positive for blood (Hemoccult) means the bleeding has been going on for some time. Endoscopy and imaging studies may be done. Emergency treatment may include:  IV medicines or fluids.  Blood transfusions.  Surgery. Hospital care is required for high risk patients or when IV fluids or blood is needed. Upper GI bleeding can cause shock and death if not controlled. HOME CARE INSTRUCTIONS   Your treatment does not require hospital care at this time.  Remain at rest until your condition improves.  Drink clear liquids as tolerated.  Avoid:  Alcohol.  Nicotine.  Aspirin.  Any other anti-inflammatory medicine (ibuprofen, naproxen, and many others).  Medications to suppress stomach acid or vomiting may be needed. Take all your medicine as prescribed.  Be sure to see your caregiver for  follow-up as recommended. SEEK IMMEDIATE MEDICAL CARE IF:   You have repeated vomiting, dehydration, fainting, or extreme weakness.  You are vomiting large amounts of bloody or dark material.  You pass large, dark or bloody stools. Document Released: 05/12/2004 Document Revised: 06/27/2011 Document Reviewed: 05/28/2008 [facility] Patient Information 2015 [place], [place]. This information is not intended to replace advice given to you by your health care provider. Make sure you discuss any questions you have with your health care provider.

## 2014-01-08 NOTE — ED Provider Notes (Signed)
CSN: [id number]     Arrival date & time 01/08/14  1533 History  This chart was scribed for Geoffery Lyons, MD by Bronson Curb, ED Scribe. This patient was seen in room APA09/APA09 and the patient's care was started at 4:56 PM.    Chief Complaint  Patient presents with  . Abdominal Pain    The history is provided by the patient. No language interpreter was used.    HPI Comments: Dustin Rhodes is a 28 y.o. male who presents to the Emergency Department complaining of abdominal pain that began yesterday. Patient was seen here yesterday for brownish red blood in stool where he was treated and informed to return if his symptoms worsened. Patient states he woke up yesterday with severe abdominal cramping with associated nausea, hematemesis (bright red), fatigue, and weakness. 10 BMs this morning (due to CT contrast). Patient denies fever or cold symptoms. He also denies sick contacts, but states he works at [employer] so it is possible. Patient states she was taking 4 ibuprofen for a day for several months for relief of chronic pain due to a previous rib fracture and suspects this may be related. Patient has history of GERD, but has not been prescribed any medication for treatment.  Past Medical History  Diagnosis Date  . ADHD (attention deficit hyperactivity disorder)   . Acid reflux    Past Surgical History  Procedure Laterality Date  . Tympanoplasty    . Wisdom tooth extraction     Family History  Problem Relation Age of Onset  . Asthma Mother   . Migraines Mother   . Cancer Other   . Heart attack Other   . Alzheimer's disease Other    History  Substance Use Topics  . Smoking status: Current Every Day Smoker -- 1.00 packs/day for 10 years    Types: Cigarettes  . Smokeless tobacco: Former Neurosurgeon  . Alcohol Use: No    Review of Systems  Constitutional: Positive for fatigue. Negative for fever.  Gastrointestinal: Positive for nausea, vomiting, abdominal pain and blood in  stool.  Neurological: Positive for weakness.  All other systems reviewed and are negative.     Allergies  Goodys body pain and Tramadol  Home Medications   Prior to Admission medications   Medication Sig Start Date End Date Taking? Authorizing Provider  HYDROcodone-acetaminophen (NORCO/VICODIN) 5-325 MG per tablet Take 1 tablet by mouth every 6 (six) hours as needed (pain).   Yes Historical Provider, MD   BP 135/88  Pulse 86  Temp(Src) 98.2 F (36.8 C) (Oral)  Resp 14  Ht  (1.727 m)  Wt 230 lb (104.327 kg)  BMI 34.98 kg/m2  SpO2 98%  Physical Exam  Nursing note and vitals reviewed. Constitutional: He is oriented to person, place, and time. He appears well-developed and well-nourished. No distress.  HENT:  Head: Normocephalic and atraumatic.  Eyes: Conjunctivae and EOM are normal.  Neck: Neck supple. No tracheal deviation present.  Cardiovascular: Normal rate.   Pulmonary/Chest: Effort normal. No respiratory distress.  Abdominal: Soft. He exhibits no distension. There is no tenderness.  Musculoskeletal: Normal range of motion.  Neurological: He is alert and oriented to person, place, and time.  Skin: Skin is warm and dry.  Psychiatric: He has a normal mood and affect. His behavior is normal.    ED Course  Procedures (including critical care time)  DIAGNOSTIC STUDIES: Oxygen Saturation is 98% on room air, normal by my interpretation.    COORDINATION OF CARE:  At 1705 Discussed treatment plan with patient which includes Prilosec and GI referral. Patient agrees.   Labs Review Labs Reviewed - No data to display  Imaging Review Ct Abdomen Pelvis W Contrast  01/07/2014   CLINICAL DATA:  Left abdominal pain, blood in stool  EXAM: CT ABDOMEN AND PELVIS WITH CONTRAST  TECHNIQUE: Multidetector CT imaging of the abdomen and pelvis was performed using the standard protocol following bolus administration of intravenous contrast.  CONTRAST:  50mL OMNIPAQUE IOHEXOL 300  MG/ML SOLN, OMNIPAQUE IOHEXOL 300 MG/ML SOLN  COMPARISON:  09/29/2013  FINDINGS: Lung bases are unremarkable. Sagittal images of the spine are unremarkable. Enhanced liver, spleen, pancreas and adrenal glands are unremarkable. Enhanced kidneys are symmetrical in size. No aortic aneurysm. No hydronephrosis or hydroureter.  No small bowel obstruction. No ascites or free air. No adenopathy. Normal appendix. No pericecal inflammation. Unremarkable terminal ileum.  The urinary bladder is unremarkable. Prostate gland and seminal vesicles are unremarkable. No distal colonic obstruction. No colitis or diverticulitis. No inguinal adenopathy. No destructive bony lesions are noted within pelvis.  IMPRESSION: 1. No acute inflammatory process within abdomen or pelvis. 2. No hydronephrosis or hydroureter. 3. Normal appendix.  No pericecal inflammation. 4. No colitis or diverticulitis.   Electronically Signed   By: Natasha Mead M.D.   On: 01/07/2014 12:48     EKG Interpretation None      MDM   Final diagnoses:  None    Patient seen here with similar complaints last night and had negative workup.  He returns with complaints of vomiting a small amount of blood.  Abd benign.  Will prescribe prilosec for presumed gastritis.  To return prn.  I personally performed the services described in this documentation, which was scribed in my presence. The recorded information has been reviewed and is accurate.     Geoffery Lyons, MD 01/09/14 [phone]

## 2014-01-16 ENCOUNTER — Ambulatory Visit (INDEPENDENT_AMBULATORY_CARE_PROVIDER_SITE_OTHER): Payer: Self-pay | Admitting: Internal Medicine

## 2014-10-07 ENCOUNTER — Emergency Department (HOSPITAL_COMMUNITY)
Admission: EM | Admit: 2014-10-07 | Discharge: 2014-10-08 | Disposition: A | Discharge status: Home / Self Care | Payer: Self-pay | Attending: Emergency Medicine | Admitting: Emergency Medicine

## 2014-10-07 ENCOUNTER — Encounter (HOSPITAL_COMMUNITY): Payer: Self-pay | Admitting: *Deleted

## 2014-10-07 DIAGNOSIS — S93402A Sprain of unspecified ligament of left ankle, initial encounter: Secondary | ICD-10-CM | POA: Insufficient documentation

## 2014-10-07 DIAGNOSIS — Y998 Other external cause status: Secondary | ICD-10-CM | POA: Insufficient documentation

## 2014-10-07 DIAGNOSIS — Z8659 Personal history of other mental and behavioral disorders: Secondary | ICD-10-CM | POA: Insufficient documentation

## 2014-10-07 DIAGNOSIS — Z720 Tobacco use: Secondary | ICD-10-CM | POA: Insufficient documentation

## 2014-10-07 DIAGNOSIS — Y9289 Other specified places as the place of occurrence of the external cause: Secondary | ICD-10-CM | POA: Insufficient documentation

## 2014-10-07 DIAGNOSIS — Y9389 Activity, other specified: Secondary | ICD-10-CM | POA: Insufficient documentation

## 2014-10-07 DIAGNOSIS — K219 Gastro-esophageal reflux disease without esophagitis: Secondary | ICD-10-CM | POA: Insufficient documentation

## 2014-10-07 DIAGNOSIS — W1842XA Slipping, tripping and stumbling without falling due to stepping into hole or opening, initial encounter: Secondary | ICD-10-CM | POA: Insufficient documentation

## 2014-10-07 NOTE — ED Notes (Signed)
Pt c/o pain to left ankle. Pt states he twisted it about 3 months ago.

## 2014-10-08 MED ORDER — PREDNISONE 10 MG PO TABS
60.0000 mg | ORAL_TABLET | Freq: Once | ORAL | Status: AC
  Administered 2014-10-08: 60 mg via ORAL
  Filled 2014-10-08 (×2): qty 1

## 2014-10-08 MED ORDER — ONDANSETRON HCL 4 MG PO TABS
4.0000 mg | ORAL_TABLET | Freq: Once | ORAL | Status: AC
  Administered 2014-10-08: 4 mg via ORAL
  Filled 2014-10-08: qty 1

## 2014-10-08 MED ORDER — ACETAMINOPHEN-CODEINE #3 300-30 MG PO TABS: 1.0000 | ORAL_TABLET | Freq: Four times a day (QID) | ORAL | Status: DC | PRN

## 2014-10-08 MED ORDER — DEXAMETHASONE 4 MG PO TABS: 4.0000 mg | ORAL_TABLET | Freq: Two times a day (BID) | ORAL | Status: DC

## 2014-10-08 MED ORDER — ACETAMINOPHEN-CODEINE #3 300-30 MG PO TABS
2.0000 | ORAL_TABLET | Freq: Once | ORAL | Status: AC
  Administered 2014-10-08: 2 via ORAL
  Filled 2014-10-08: qty 2

## 2014-10-08 NOTE — Discharge Instructions (Signed)
Please use the ankle splint when up and about. You do not have to sleep in this device. Please elevate your ankle is much as possible. Use Decadron 2 times daily with food. Use Tylenol for mild pain, use Tylenol codeine for more severe pain. Tylenol codeine may cause drowsiness, please do not drink alcohol, operating machinery, operate a vehicle, or participate in activities requiring concentration when taking this medication. Ankle Sprain An ankle sprain is an injury to the strong, fibrous tissues (ligaments) that hold your ankle bones together.  HOME CARE   Put ice on your ankle for 1-2 days or as told by your doctor.  Put ice in a plastic bag.  Place a towel between your skin and the bag.  Leave the ice on for 15-20 minutes at a time, every 2 hours while you are awake.  Only take medicine as told by your doctor.  Raise (elevate) your injured ankle above the level of your heart as much as possible for 2-3 days.  Use crutches if your doctor tells you to. Slowly put your own weight on the affected ankle. Use the crutches until you can walk without pain.  If you have a plaster splint:  Do not rest it on anything harder than a pillow for 24 hours.  Do not put weight on it.  Do not get it wet.  Take it off to shower or bathe.  If given, use an elastic wrap or support stocking for support. Take the wrap off if your toes lose feeling (numb), tingle, or turn cold or blue.  If you have an air splint:  Add or let out air to make it comfortable.  Take it off at night and to shower and bathe.  Wiggle your toes and move your ankle up and down often while you are wearing it. GET HELP IF:  You have rapidly increasing bruising or puffiness (swelling).  Your toes feel very cold.  You lose feeling in your foot.  Your medicine does not help your pain. GET HELP RIGHT AWAY IF:   Your toes lose feeling (numb) or turn blue.  You have severe pain that is increasing. MAKE SURE YOU:    Understand these instructions.  Will watch your condition.  Will get help right away if you are not doing well or get worse. Document Released: 09/21/2007 Document Revised: 08/19/2013 Document Reviewed: 10/17/2011 [facility] Patient Information 2015 [place], [place]. This information is not intended to replace advice given to you by your health care provider. Make sure you discuss any questions you have with your health care provider.

## 2014-10-08 NOTE — ED Provider Notes (Signed)
CSN: [id number]     Arrival date & time 10/07/14  2238 History   First MD Initiated Contact with Patient 10/07/14 2329     Chief Complaint  Patient presents with  . Foot Pain     (Consider location/radiation/quality/duration/timing/severity/associated sxs/prior Treatment) HPI Comments: Pt states he stepped in a hole and twisted the left ankle about 3 months ago. He c/o left ankle pain since that time. He is unsure if he may have reinjured the left ankle recently. He has pain when he walks on it, and he has pain when he pivots and turns involving the left ankle. He has not had any high fevers. There's been no red streaking from the ankle area. No previous operations or procedures involving the left ankle.  Patient is a 30 y.o. male presenting with lower extremity pain. The history is provided by the patient.  Foot Pain This is a new problem. Associated symptoms include arthralgias.    Past Medical History  Diagnosis Date  . ADHD (attention deficit hyperactivity disorder)   . Acid reflux    Past Surgical History  Procedure Laterality Date  . Tympanoplasty    . Wisdom tooth extraction     Family History  Problem Relation Age of Onset  . Asthma Mother   . Migraines Mother   . Cancer Other   . Heart attack Other   . Alzheimer's disease Other    History  Substance Use Topics  . Smoking status: Current Every Day Smoker -- 1.00 packs/day for 10 years    Types: Cigarettes  . Smokeless tobacco: Former Neurosurgeon  . Alcohol Use: No    Review of Systems  Musculoskeletal: Positive for arthralgias.  All other systems reviewed and are negative.     Allergies  Goodys body pain and Tramadol  Home Medications   Prior to Admission medications   Medication Sig Start Date End Date Taking? Authorizing Provider  HYDROcodone-acetaminophen (NORCO/VICODIN) 5-325 MG per tablet Take 1 tablet by mouth every 6 (six) hours as needed (pain).    Historical Provider, MD  omeprazole (PRILOSEC) 20 MG  capsule Take 1 capsule (20 mg total) by mouth 2 (two) times daily. 01/08/14   Geoffery Lyons, MD   BP 132/88 mmHg  Pulse 104  Temp(Src) 98.8 F (37.1 C)  Resp 20  Ht 5\' 8"  (1.727 m)  Wt 235 lb (106.595 kg)  BMI 35.74 kg/m2  SpO2 96% Physical Exam  Constitutional: He is oriented to person, place, and time. He appears well-developed and well-nourished.  Non-toxic appearance.  HENT:  Head: Normocephalic.  Right Ear: Tympanic membrane and external ear normal.  Left Ear: Tympanic membrane and external ear normal.  Eyes: EOM and lids are normal. Pupils are equal, round, and reactive to light.  Neck: Normal range of motion. Neck supple. Carotid bruit is not present.  Cardiovascular: Normal rate, regular rhythm, normal heart sounds, intact distal pulses and normal pulses.   Pulmonary/Chest: Breath sounds normal. No respiratory distress.  Abdominal: Soft. Bowel sounds are normal. There is no tenderness. There is no guarding.  Musculoskeletal:       Left hip: He exhibits normal range of motion, normal strength and no tenderness.       Left knee: He exhibits normal range of motion, no swelling, no effusion, no deformity and no erythema.       Left ankle: He exhibits no swelling, no deformity and no laceration. Tenderness. Lateral malleolus tenderness found.  Lymphadenopathy:       Head (right  side): No submandibular adenopathy present.       Head (left side): No submandibular adenopathy present.    He has no cervical adenopathy.  Neurological: He is alert and oriented to person, place, and time. He has normal strength. No cranial nerve deficit or sensory deficit.  Skin: Skin is warm and dry.  Psychiatric: He has a normal mood and affect. His speech is normal.  Nursing note and vitals reviewed.   ED Course  Procedures (including critical care time) Labs Review Labs Reviewed - No data to display  Imaging Review No results found.   EKG Interpretation None      MDM Vital signs are  nonacute. There is good range of motion noted of the left hip and knee. There is some swelling noted of the lateral malleolus. There is tenderness to palpation of the lateral malleolus. There is no palpable deformity appreciated. There is full range of motion of the toes. The Achilles tendon is intact. Suspect the patient may have sustained some deeper injury 3 months ago and may have reinjured it somehow recently. Ankle stirrup splint applied, crutches applied, ice pack given. The patient will be treated with Tylenol codeine and Decadron, and he will follow-up with orthopedics this week for additional evaluation concerning his ankle.    Final diagnoses:  None    *I have reviewed nursing notes, vital signs, and all appropriate lab and imaging results for this patient.[address], PA-C 10/08/14 1857  Shon Baton, MD 10/10/14 [phone]

## 2014-12-18 ENCOUNTER — Emergency Department (HOSPITAL_COMMUNITY)
Admission: EM | Admit: 2014-12-18 | Discharge: 2014-12-18 | Disposition: A | Discharge status: Home / Self Care | Payer: Self-pay | Attending: Emergency Medicine | Admitting: Emergency Medicine

## 2014-12-18 ENCOUNTER — Emergency Department (HOSPITAL_COMMUNITY): Payer: Self-pay

## 2014-12-18 ENCOUNTER — Encounter (HOSPITAL_COMMUNITY): Payer: Self-pay | Admitting: Emergency Medicine

## 2014-12-18 DIAGNOSIS — Z8659 Personal history of other mental and behavioral disorders: Secondary | ICD-10-CM | POA: Insufficient documentation

## 2014-12-18 DIAGNOSIS — R1011 Right upper quadrant pain: Secondary | ICD-10-CM | POA: Insufficient documentation

## 2014-12-18 DIAGNOSIS — Z720 Tobacco use: Secondary | ICD-10-CM | POA: Insufficient documentation

## 2014-12-18 DIAGNOSIS — K219 Gastro-esophageal reflux disease without esophagitis: Secondary | ICD-10-CM | POA: Insufficient documentation

## 2014-12-18 DIAGNOSIS — R110 Nausea: Secondary | ICD-10-CM | POA: Insufficient documentation

## 2014-12-18 DIAGNOSIS — R319 Hematuria, unspecified: Secondary | ICD-10-CM | POA: Insufficient documentation

## 2014-12-18 LAB — URINE MICROSCOPIC-ADD ON

## 2014-12-18 LAB — CBC WITH DIFFERENTIAL/PLATELET
Basophils Absolute: 0 10*3/uL (ref 0.0–0.1)
Basophils Relative: 0 % (ref 0–1)
EOS ABS: 0.2 10*3/uL (ref 0.0–0.7)
EOS PCT: 3 % (ref 0–5)
HCT: 41.3 % (ref 39.0–52.0)
Hemoglobin: 14.4 g/dL (ref 13.0–17.0)
LYMPHS ABS: 3.4 10*3/uL (ref 0.7–4.0)
LYMPHS PCT: 41 % (ref 12–46)
MCH: 30.6 pg (ref 26.0–34.0)
MCHC: 34.9 g/dL (ref 30.0–36.0)
MCV: 87.7 fL (ref 78.0–100.0)
MONO ABS: 0.5 10*3/uL (ref 0.1–1.0)
Monocytes Relative: 6 % (ref 3–12)
Neutro Abs: 4.1 10*3/uL (ref 1.7–7.7)
Neutrophils Relative %: 50 % (ref 43–77)
PLATELETS: 216 10*3/uL (ref 150–400)
RBC: 4.71 MIL/uL (ref 4.22–5.81)
RDW: 12.7 % (ref 11.5–15.5)
WBC: 8.3 10*3/uL (ref 4.0–10.5)

## 2014-12-18 LAB — URINALYSIS, ROUTINE W REFLEX MICROSCOPIC
BILIRUBIN URINE: NEGATIVE
GLUCOSE, UA: NEGATIVE mg/dL
Ketones, ur: NEGATIVE mg/dL
Leukocytes, UA: NEGATIVE
Nitrite: NEGATIVE
PROTEIN: NEGATIVE mg/dL
SPECIFIC GRAVITY, URINE: 1.025 (ref 1.005–1.030)
Urobilinogen, UA: 0.2 mg/dL (ref 0.0–1.0)
pH: 5.5 (ref 5.0–8.0)

## 2014-12-18 LAB — COMPREHENSIVE METABOLIC PANEL
ALT: 20 U/L (ref 17–63)
AST: 18 U/L (ref 15–41)
Albumin: 3.9 g/dL (ref 3.5–5.0)
Alkaline Phosphatase: 40 U/L (ref 38–126)
Anion gap: 6 (ref 5–15)
BUN: 12 mg/dL (ref 6–20)
CHLORIDE: 107 mmol/L (ref 101–111)
CO2: 28 mmol/L (ref 22–32)
Calcium: 8.9 mg/dL (ref 8.9–10.3)
Creatinine, Ser: 0.75 mg/dL (ref 0.61–1.24)
GFR calc non Af Amer: >60 mL/min (ref >60)
Glucose, Bld: 98 mg/dL (ref 65–99)
Potassium: 3.7 mmol/L (ref 3.5–5.1)
SODIUM: 141 mmol/L (ref 135–145)
Total Bilirubin: 0.8 mg/dL (ref 0.3–1.2)
Total Protein: 6.6 g/dL (ref 6.5–8.1)

## 2014-12-18 LAB — LIPASE, BLOOD: Lipase: 15 U/L — ABNORMAL LOW (ref 22–51)

## 2014-12-18 MED ORDER — IBUPROFEN 800 MG PO TABS
800.0000 mg | ORAL_TABLET | Freq: Once | ORAL | Status: AC
  Administered 2014-12-18: 800 mg via ORAL
  Filled 2014-12-18: qty 1

## 2014-12-18 NOTE — ED Notes (Signed)
Pt c/o waking up with "upset stomach". States urinated and saw pink urine. Since then has complained of right sided abd pain that is stabbing intermittent. Denies vomiting but has been nauseated. Color wnl. Nad. Had 2 bm today and was watery-denies bloody or black stools. Mm wet.

## 2014-12-18 NOTE — Discharge Instructions (Signed)

## 2014-12-18 NOTE — ED Provider Notes (Signed)
CSN: [id number]     Arrival date & time 12/18/14  1700 History   First MD Initiated Contact with Patient 12/18/14 1710     Chief Complaint  Patient presents with  . Abdominal Pain  . Hematuria     (Consider location/radiation/quality/duration/timing/severity/associated sxs/prior Treatment) The history is provided by the patient.   Dustin Rhodes is a 29 y.o. male with a history significant for acid reflux presenting with upset stomach and hematuria which started today.  He woke around 10 am feeling nauseated with upper abdominal pain which he blamed on eating from [employer] last night.  He describes waxing and waning aching pain which was improved after eating a sandwich for lunch then having a small but loose and non bloody bm. He urinated around 1 pm and his urine was very dark with blood tinge, but denies dysuria, urinary frequency or urgency. He has no personal history of kidney stones but strong family history.  He denies flank or back pain.  Also denies fevers, chills, vomiting.  He has taken no medicines today for his symptoms.    Past Medical History  Diagnosis Date  . ADHD (attention deficit hyperactivity disorder)   . Acid reflux    Past Surgical History  Procedure Laterality Date  . Tympanoplasty    . Wisdom tooth extraction     Family History  Problem Relation Age of Onset  . Asthma Mother   . Migraines Mother   . Cancer Other   . Heart attack Other   . Alzheimer's disease Other    Social History  Substance Use Topics  . Smoking status: Current Every Day Smoker -- 1.00 packs/day for 10 years    Types: Cigarettes  . Smokeless tobacco: Former Neurosurgeon  . Alcohol Use: Yes     Comment: once a month/beer    Review of Systems  Constitutional: Negative for fever.  HENT: Negative for congestion and sore throat.   Eyes: Negative.   Respiratory: Negative for chest tightness and shortness of breath.   Cardiovascular: Negative for chest pain.  Gastrointestinal:  Positive for nausea and abdominal pain. Negative for vomiting, diarrhea and constipation.  Genitourinary: Positive for hematuria. Negative for dysuria, frequency, flank pain, decreased urine volume, discharge and penile pain.  Musculoskeletal: Negative for joint swelling, arthralgias and neck pain.  Skin: Negative.  Negative for rash and wound.  Neurological: Negative for dizziness, weakness, light-headedness, numbness and headaches.  Psychiatric/Behavioral: Negative.       Allergies  Goodys body pain and Tramadol  Home Medications   Prior to Admission medications   Medication Sig Start Date End Date Taking? Authorizing Provider  acetaminophen-codeine (TYLENOL #3) 300-30 MG per tablet Take 1-2 tablets by mouth every 6 (six) hours as needed for moderate pain. Patient not taking: Reported on 12/18/2014 10/08/14   Ivery Quale, PA-C  dexamethasone (DECADRON) 4 MG tablet Take 1 tablet (4 mg total) by mouth 2 (two) times daily with a meal. Patient not taking: Reported on 12/18/2014 10/08/14   Ivery Quale, PA-C  omeprazole (PRILOSEC) 20 MG capsule Take 1 capsule (20 mg total) by mouth 2 (two) times daily. Patient not taking: Reported on 12/18/2014 01/08/14   Geoffery Lyons, MD   BP 133/93 mmHg  Pulse 62  Temp(Src) 98.9 F (37.2 C) (Oral)  Resp 16  Ht  (1.727 m)  Wt 235 lb (106.595 kg)  BMI 35.74 kg/m2  SpO2 98% Physical Exam  Constitutional: He appears well-developed and well-nourished.  HENT:  Head: Normocephalic  and atraumatic.  Eyes: Conjunctivae are normal.  Neck: Normal range of motion.  Cardiovascular: Normal rate, regular rhythm, normal heart sounds and intact distal pulses.   Pulmonary/Chest: Effort normal and breath sounds normal. He has no wheezes.  Abdominal: Soft. Bowel sounds are normal. He exhibits no mass. There is no hepatosplenomegaly. There is tenderness in the right upper quadrant. There is no rebound, no guarding, no CVA tenderness and negative Murphy's sign. No  hernia.  Musculoskeletal: Normal range of motion.  Neurological: He is alert.  Skin: Skin is warm and dry.  Psychiatric: He has a normal mood and affect.  Nursing note and vitals reviewed.   ED Course  Procedures (including critical care time) Labs Review Labs Reviewed  URINALYSIS, ROUTINE W REFLEX MICROSCOPIC (NOT AT [facility]) - Abnormal; Notable for the following:    Hgb urine dipstick SMALL (*)    All other components within normal limits  LIPASE, BLOOD - Abnormal; Notable for the following:    Lipase 15 (*)    All other components within normal limits  CBC WITH DIFFERENTIAL/PLATELET  COMPREHENSIVE METABOLIC PANEL  URINE MICROSCOPIC-ADD ON    Imaging Review Ct Renal Stone Study  12/18/2014   CLINICAL DATA:  Acute onset of right flank pain and hematuria. Initial encounter.  EXAM: CT ABDOMEN AND PELVIS WITHOUT CONTRAST  TECHNIQUE: Multidetector CT imaging of the abdomen and pelvis was performed following the standard protocol without IV contrast.  COMPARISON:  CT of the abdomen and pelvis from 01/07/2014  FINDINGS: The visualized lung bases are clear.  The liver and spleen are unremarkable in appearance. The gallbladder is within normal limits. The pancreas and adrenal glands are unremarkable.  The kidneys are unremarkable in appearance. There is no evidence of hydronephrosis. No renal or ureteral stones are seen. No perinephric stranding is appreciated.  No free fluid is identified. The small bowel is unremarkable in appearance. The stomach is within normal limits. No acute vascular abnormalities are seen.  The appendix is normal in caliber, without evidence of appendicitis. The appendix extends adjacent to the inferior tip of the liver. The colon is unremarkable in appearance.  The bladder is decompressed and not well assessed. The prostate remains normal in size. No inguinal lymphadenopathy is seen.  No acute osseous abnormalities are identified. Chronic left-sided rib deformities are noted,  with an incompletely healed chronic left posterior tenth rib fracture.  IMPRESSION: 1. No acute abnormality seen within the abdomen or pelvis. No evidence of hydronephrosis. No renal or ureteral stones seen. 2. Chronic left-sided rib deformities, with an incompletely healed fracture of the left posterior tenth rib again seen.   Electronically Signed   By: Roanna Raider M.D.   On: 12/18/2014 20:12   I have personally reviewed and evaluated these images and lab results as part of my medical decision-making.   EKG Interpretation None      MDM   Final diagnoses:  Hematuria  Right upper quadrant pain    Patients labs reviewed.  Radiological studies were viewed, interpreted and considered during the medical decision making and disposition process. I agree with radiologists reading.  Results were also discussed with patient. Also discussed pt with Dr.Zackowski.  Urine cx pending.  He was referred to urology for further evaluation of sx.  Painless hematuria, ruq pain, improved during ed visit of unclear etiology. No cva pain, no Ct evidence or lab abnormalities suggesting gb or liver source of sx.  Pt to call for appt with [facility] for further eval  of hematuria.  The patient appears reasonably screened and/or stabilized for discharge and I doubt any other medical condition or other [facility] requiring further screening, evaluation, or treatment in the ED at this time prior to discharge.      Burgess Amor, PA-C 12/19/14 1536  Vanetta Mulders, MD 12/24/14 [phone]

## 2014-12-18 NOTE — ED Notes (Signed)
Pt ambulated to restroom & returned to room w/ no complications. 

## 2015-12-28 ENCOUNTER — Emergency Department (HOSPITAL_COMMUNITY): Payer: Self-pay

## 2015-12-28 ENCOUNTER — Emergency Department (HOSPITAL_COMMUNITY)
Admission: EM | Admit: 2015-12-28 | Discharge: 2015-12-28 | Disposition: A | Discharge status: Home / Self Care | Payer: Self-pay | Attending: Emergency Medicine | Admitting: Emergency Medicine

## 2015-12-28 ENCOUNTER — Encounter (HOSPITAL_COMMUNITY): Payer: Self-pay | Admitting: Emergency Medicine

## 2015-12-28 DIAGNOSIS — X501XXA Overexertion from prolonged static or awkward postures, initial encounter: Secondary | ICD-10-CM | POA: Insufficient documentation

## 2015-12-28 DIAGNOSIS — S46911A Strain of unspecified muscle, fascia and tendon at shoulder and upper arm level, right arm, initial encounter: Secondary | ICD-10-CM | POA: Insufficient documentation

## 2015-12-28 DIAGNOSIS — Y9389 Activity, other specified: Secondary | ICD-10-CM | POA: Insufficient documentation

## 2015-12-28 DIAGNOSIS — Z791 Long term (current) use of non-steroidal anti-inflammatories (NSAID): Secondary | ICD-10-CM | POA: Insufficient documentation

## 2015-12-28 DIAGNOSIS — F17210 Nicotine dependence, cigarettes, uncomplicated: Secondary | ICD-10-CM | POA: Insufficient documentation

## 2015-12-28 DIAGNOSIS — F909 Attention-deficit hyperactivity disorder, unspecified type: Secondary | ICD-10-CM | POA: Insufficient documentation

## 2015-12-28 DIAGNOSIS — Y999 Unspecified external cause status: Secondary | ICD-10-CM | POA: Insufficient documentation

## 2015-12-28 DIAGNOSIS — Y929 Unspecified place or not applicable: Secondary | ICD-10-CM | POA: Insufficient documentation

## 2015-12-28 NOTE — ED Triage Notes (Signed)
Pt states that on Friday (9/8) he was thrown from the lawn mover and complains of shoulder pain ever since. Pt states that shoulder hurt now when he moves it from side-to-side.

## 2015-12-28 NOTE — ED Provider Notes (Signed)
[facility]-EMERGENCY DEPT Provider Note   CSN: [id number] Arrival date & time: 12/28/15  1633  By signing my name below, I, Dustin Rhodes, attest that this documentation has been prepared under the direction and in the presence of Dustin QualeHobson Michela Herst, PA-C. Electronically Signed: Placido SouLogan Rhodes, ED Scribe. 12/28/15. 5:05 PM.   History   Chief Complaint Chief Complaint  Patient presents with  . Shoulder Injury    HPI HPI Comments: Dustin Rhodes is a 30 y.o. male who presents to the Emergency Department complaining of constant, moderate, right shoulder pain that occurred 3 days ago. Pt states that he was driving his lawn mower which sped up quickly resulting in him being pulled backwards and felt a pop in his right shoulder. He states his pain worsens with movement and he also experiences a popping noise in his right shoulder with ROM. Pt denies a h/o surgeries to his right shoulder. He denies decreased grip strength in his right hand, right elbow pain or other associated symptoms at this time.   The history is provided by the patient. No language interpreter was used.  Shoulder Pain   This is a new problem. The current episode started more than 2 days ago. The problem occurs constantly. The problem has not changed since onset.The pain is present in the right shoulder. The pain is moderate. Pertinent negatives include no numbness. The symptoms are aggravated by activity. There has been no history of extremity trauma. Family history is significant for no rheumatoid arthritis and no gout.    Past Medical History:  Diagnosis Date  . Acid reflux   . ADHD (attention deficit hyperactivity disorder)     There are no active problems to display for this patient.   Past Surgical History:  Procedure Laterality Date  . TYMPANOPLASTY    . WISDOM TOOTH EXTRACTION         Home Medications    Prior to Admission medications   Medication Sig Start Date End Date Taking? Authorizing Provider    acetaminophen-codeine (TYLENOL #3) 300-30 MG per tablet Take 1-2 tablets by mouth every 6 (six) hours as needed for moderate pain. Patient not taking: Reported on 12/18/2014 10/08/14   Dustin QualeHobson [name], PA-C  dexamethasone (DECADRON) 4 MG tablet Take 1 tablet (4 mg total) by mouth 2 (two) times daily with a meal. Patient not taking: Reported on 12/18/2014 10/08/14   Dustin QualeHobson [name], PA-C  omeprazole (PRILOSEC) 20 MG capsule Take 1 capsule (20 mg total) by mouth 2 (two) times daily. Patient not taking: Reported on 12/18/2014 01/08/14   Dustin Lyonsouglas Delo, MD    Family History Family History  Problem Relation Age of Onset  . Asthma Mother   . Migraines Mother   . Cancer Other   . Heart attack Other   . Alzheimer's disease Other     Social History Social History  Substance Use Topics  . Smoking status: Current Every Day Smoker    Packs/day: 1.00    Years: 10.00    Types: Cigarettes  . Smokeless tobacco: Former NeurosurgeonUser  . Alcohol use Yes     Comment: once a month/beer     Allergies   Goodys body pain [aspirin-acetaminophen] and Tramadol   Review of Systems Review of Systems  Musculoskeletal: Positive for arthralgias. Negative for joint swelling.  Skin: Negative for color change and wound.  Neurological: Negative for numbness.  All other systems reviewed and are negative.  Physical Exam Updated Vital Signs BP 152/95 (BP Location: Right Arm)   Pulse  75   Temp 98.3 F (36.8 C) (Oral)   Resp 18   SpO2 100%   Physical Exam  Constitutional: He is oriented to person, place, and time. He appears well-developed and well-nourished. No distress.  HENT:  Head: Normocephalic and atraumatic.  Eyes: EOM are normal.  Neck: Normal range of motion. Carotid bruit is not present.  No right carotid bruit   Cardiovascular: Normal rate, regular rhythm, normal heart sounds and intact distal pulses.  Exam reveals no gallop and no friction rub.   No murmur heard. Pulses:      Radial pulses are 2+ on the  right side.  Pulmonary/Chest: Effort normal. He has wheezes.  Soft inspiratory wheezes and mild rhonchi noted with auscultation   Abdominal: Soft. He exhibits no distension. There is no tenderness.  Musculoskeletal: Normal range of motion. He exhibits tenderness. He exhibits no edema or deformity.  No deformity noted to the right forearm. No deformity or effusion noted to the right elbow. No hematoma or deformities noted to the bicep/tricep region of the right arm. TTP to the right clavicle. Pain in the anterior right shoulder. Tightness and tenseness of the right upper trapezius extending into the neck and to the right mid-scapula.   Neurological: He is alert and oriented to person, place, and time.  Skin: Skin is warm and dry. Capillary refill takes less than 2 seconds. He is not diaphoretic.  Right hand: cap refill <2 seconds  Psychiatric: He has a normal mood and affect. Judgment normal.  Nursing note and vitals reviewed.  ED Treatments / Results  Labs (all labs ordered are listed, but only abnormal results are displayed) Labs Reviewed - No data to display  EKG  EKG Interpretation None       Radiology Dg Clavicle Right  Result Date: 12/28/2015 CLINICAL DATA:  Right shoulder pain, initial encounter, thrown off a lawnmower EXAM: RIGHT CLAVICLE - 2+ VIEWS COMPARISON:  None. FINDINGS: Two views of the right clavicle submitted. No acute fracture or subluxation. No radiopaque foreign body. IMPRESSION: Negative. Electronically Signed   By: Dustin Rhodes M.D.   On: 12/28/2015 17:40   Dg Shoulder Right  Result Date: 12/28/2015 CLINICAL DATA:  Thrown off a lawnmower, right shoulder pain EXAM: RIGHT SHOULDER - 2+ VIEW COMPARISON:  None. FINDINGS: Three views of the right shoulder submitted. No acute fracture or subluxation. No radiopaque foreign body. AC joint and glenohumeral joint are preserved. IMPRESSION: Negative. Electronically Signed   By: Dustin Rhodes M.D.   On: 12/28/2015 17:41     Procedures Procedures  DIAGNOSTIC STUDIES: Oxygen Saturation is 100% on RA, normal by my interpretation.    COORDINATION OF CARE: 5:00 PM Discussed next steps with pt. Pt verbalized understanding and is agreeable with the plan.    Medications Ordered in ED Medications - No data to display   Initial Impression / Assessment and Plan / ED Course  I have reviewed the triage vital signs and the nursing notes.  Pertinent labs & imaging results that were available during my care of the patient were reviewed by me and considered in my medical decision making (see chart for details).  Clinical Course    Patient X-Ray negative for obvious fracture or dislocation.  Pt advised to follow up with orthopedics. Patient will be discharged home & is agreeable with above plan. Returns precautions discussed. Pt appears safe for discharge.  **I personally performed the services described in this documentation, which was scribed in my presence. The recorded information  has been reviewed and is accurate.* Final Clinical Impressions(s) / ED Diagnoses  Pt to see Dr Romeo Apple for follow up if not improving. Tylenol/Ibuprofen for soreness.    Final diagnoses:  None    New Prescriptions New Prescriptions   No medications on file     Dustin Quale, PA-C 12/28/15 1803    Bethann Berkshire, MD 12/29/15 2320

## 2015-12-28 NOTE — ED Notes (Signed)
Patient given a drink per PA approval.

## 2015-12-28 NOTE — Discharge Instructions (Signed)
Your xrays are negative for fracture or dislocation. Please use 600mg  of ibuprofen and 500mg  of tylenol with breakfast, lunch, dinner, and bedtime for discomfort. See Dr Romeo AppleHarrison for orthopedic evaluation of your shoulder.

## 2016-10-25 ENCOUNTER — Emergency Department (HOSPITAL_COMMUNITY): Payer: Self-pay

## 2016-10-25 ENCOUNTER — Emergency Department (HOSPITAL_COMMUNITY)
Admission: EM | Admit: 2016-10-25 | Discharge: 2016-10-25 | Disposition: A | Discharge status: Home / Self Care | Payer: Self-pay | Attending: Emergency Medicine | Admitting: Emergency Medicine

## 2016-10-25 ENCOUNTER — Encounter (HOSPITAL_COMMUNITY): Payer: Self-pay | Admitting: *Deleted

## 2016-10-25 DIAGNOSIS — Y93H2 Activity, gardening and landscaping: Secondary | ICD-10-CM | POA: Insufficient documentation

## 2016-10-25 DIAGNOSIS — F909 Attention-deficit hyperactivity disorder, unspecified type: Secondary | ICD-10-CM | POA: Insufficient documentation

## 2016-10-25 DIAGNOSIS — F17210 Nicotine dependence, cigarettes, uncomplicated: Secondary | ICD-10-CM | POA: Insufficient documentation

## 2016-10-25 DIAGNOSIS — Y999 Unspecified external cause status: Secondary | ICD-10-CM | POA: Insufficient documentation

## 2016-10-25 DIAGNOSIS — T148XXA Other injury of unspecified body region, initial encounter: Secondary | ICD-10-CM

## 2016-10-25 DIAGNOSIS — S161XXA Strain of muscle, fascia and tendon at neck level, initial encounter: Secondary | ICD-10-CM | POA: Insufficient documentation

## 2016-10-25 DIAGNOSIS — S299XXA Unspecified injury of thorax, initial encounter: Secondary | ICD-10-CM | POA: Insufficient documentation

## 2016-10-25 DIAGNOSIS — X500XXA Overexertion from strenuous movement or load, initial encounter: Secondary | ICD-10-CM | POA: Insufficient documentation

## 2016-10-25 DIAGNOSIS — Y929 Unspecified place or not applicable: Secondary | ICD-10-CM | POA: Insufficient documentation

## 2016-10-25 MED ORDER — LIDOCAINE 5 % EX PTCH
1.0000 | MEDICATED_PATCH | CUTANEOUS | Status: DC
  Administered 2016-10-25: 1 via TRANSDERMAL
  Filled 2016-10-25: qty 1

## 2016-10-25 MED ORDER — HYDROCODONE-ACETAMINOPHEN 5-325 MG PO TABS
1.0000 | ORAL_TABLET | Freq: Once | ORAL | Status: AC
  Administered 2016-10-25: 1 via ORAL
  Filled 2016-10-25: qty 1

## 2016-10-25 MED ORDER — HYDROCODONE-ACETAMINOPHEN 5-325 MG PO TABS: 1.0000 | ORAL_TABLET | Freq: Four times a day (QID) | ORAL | 0 refills | Status: DC | PRN

## 2016-10-25 MED ORDER — METHOCARBAMOL 500 MG PO TABS: 500.0000 mg | ORAL_TABLET | Freq: Two times a day (BID) | ORAL | 0 refills | Status: DC | PRN

## 2016-10-25 MED ORDER — METHOCARBAMOL 500 MG PO TABS
500.0000 mg | ORAL_TABLET | Freq: Once | ORAL | Status: AC
  Administered 2016-10-25: 500 mg via ORAL
  Filled 2016-10-25: qty 1

## 2016-10-25 NOTE — ED Provider Notes (Signed)
[facility]-EMERGENCY DEPT Provider Note   CSN: [id number] Arrival date & time: 10/25/16  0510     History   Chief Complaint Chief Complaint  Patient presents with  . Shoulder Pain    HPI Dustin Rhodes is a 31 y.o. male.  The history is provided by the patient and medical records. No language interpreter was used.  Shoulder Pain   Pertinent negatives include no numbness.   Dustin Rhodes is a 31 y.o. male  with a PMH of ADHD, reflux who presents to the Emergency Department complaining of acute onset of right-sided neck and upper back pain which began two days ago. She states that he was mowing the lawn when he accidentally ran over a tree stump. When he did this, his shoulder jerked, causing him to feel a popping sensation to his right upper back. Pain has been constant and progressively worsening since this time despite taking ibuprofen, naproxen, applying icy hot and taking warm showers. Patient states that he has strained muscles before and this regimen typically works, however he has had no relief. He denies any numbness or tingling. He is having trouble turning his head to the right and lifting his arm completely above his head. 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 He states that his job has been very difficult as he cannot turn to look behind him and just use his right dominant hand (affected extremity).   Past Medical History:  Diagnosis Date  . Acid reflux   . ADHD (attention deficit hyperactivity disorder)     There are no active problems to display for this patient.   Past Surgical History:  Procedure Laterality Date  . TYMPANOPLASTY    . WISDOM TOOTH EXTRACTION         Home Medications    Prior to Admission medications   Medication Sig Start Date End Date Taking? Authorizing Provider  HYDROcodone-acetaminophen (NORCO/VICODIN) 5-325 MG tablet Take 1 tablet by mouth every 6 (six) hours as needed for severe pain. 10/25/16   Coulter Oldaker,  Chase Picket, PA-C  ibuprofen (ADVIL,MOTRIN) 200 MG tablet Take 200 mg by mouth every 6 (six) hours as needed for mild pain or moderate pain.    [provider]  methocarbamol (ROBAXIN) 500 MG tablet Take 1 tablet (500 mg total) by mouth 2 (two) times daily as needed for muscle spasms. 10/25/16   Myrel Rappleye, Chase Picket, PA-C    Family History Family History  Problem Relation Age of Onset  . Asthma Mother   . Migraines Mother   . Cancer Other   . Heart attack Other   . Alzheimer's disease Other     Social History Social History  Substance Use Topics  . Smoking status: Current Every Day Smoker    Packs/day: 1.00    Years: 10.00    Types: Cigarettes  . Smokeless tobacco: Former Neurosurgeon  . Alcohol use Yes     Comment: once a month/beer     Allergies   Goodys body pain [aspirin-acetaminophen] and Tramadol   Review of Systems Review of Systems  Musculoskeletal: Positive for arthralgias and myalgias.  Neurological: Negative for weakness and numbness.     Physical Exam Updated Vital Signs BP 122/89   Pulse 61   Temp 97.9 F (36.6 C) (Oral)   Resp 18   Ht 5\' 8"  (1.727 m)   Wt 106.6 kg (235 lb)   SpO2 95%   BMI 35.73 kg/m   Physical Exam  Constitutional: He  is oriented to person, place, and time. He appears well-developed and well-nourished. No distress.  HENT:  Head: Normocephalic and atraumatic.  Cardiovascular: Normal rate, regular rhythm and normal heart sounds.   No murmur heard. Pulmonary/Chest: Effort normal and breath sounds normal. No respiratory distress.  Abdominal: Soft. He exhibits no distension. There is no tenderness.  Musculoskeletal:       Back:  No midline C/T/L spine tenderness. Decreased ROM with turning the head to the right. Full ROM of the shoulder, although with pain. Visible muscle spasm to the right trapezius.  Neurological: He is alert and oriented to person, place, and time.  Skin: Skin is warm and dry.  Nursing note and vitals  reviewed.    ED Treatments / Results  Labs (all labs ordered are listed, but only abnormal results are displayed) Labs Reviewed - No data to display  EKG  EKG Interpretation None       Radiology Dg Cervical Spine Complete  Result Date: 10/25/2016 CLINICAL DATA:  Injury, neck pain EXAM: CERVICAL SPINE - COMPLETE 4+ VIEW COMPARISON:  None. FINDINGS: There is no evidence of cervical spine fracture or prevertebral soft tissue swelling. Alignment is normal. No other significant bone abnormalities are identified. IMPRESSION: Negative cervical spine radiographs. Electronically Signed   By: Marlan Palauharles  Clark M.D.   On: 10/25/2016 07:55   Dg Shoulder Right  Result Date: 10/25/2016 CLINICAL DATA:  Pain following pulling injury EXAM: RIGHT SHOULDER - 2+ VIEW COMPARISON:  December 28, 2015 FINDINGS: Frontal, Y scapular, and axillary views were obtained. There is no fracture or dislocation. Joint spaces appear normal. No erosive change. IMPRESSION: No fracture or dislocation.  No apparent arthropathy. Electronically Signed   By: Bretta BangWilliam  Woodruff III M.D.   On: 10/25/2016 07:44    Procedures Procedures (including critical care time)  Medications Ordered in ED Medications  lidocaine (LIDODERM) 5 % 1 patch (1 patch Transdermal Patch Applied 10/25/16 0747)  methocarbamol (ROBAXIN) tablet 500 mg (500 mg Oral Given 10/25/16 0747)  HYDROcodone-acetaminophen (NORCO/VICODIN) 5-325 MG per tablet 1 tablet (1 tablet Oral Given 10/25/16 0747)     Initial Impression / Assessment and Plan / ED Course  I have reviewed the triage vital signs and the nursing notes.  Pertinent labs & imaging results that were available during my care of the patient were reviewed by me and considered in my medical decision making (see chart for details).    Bridget HartshornCasey L Ricotta is a 31 y.o. male who presents to ED for right upper neck / back pain x 2 days which occurred while mowing the lawn. Likely musk. No midline tenderness.  Cervical and shoulder plain films obtained and negative. Offered trigger point injection for visible muscle spasms him for further relief, however patient stated he was scared of needles and declined. Symptomatic care in ED with moderate relief. Will follow up with PCP or ortho if symptoms persist. Reasons to return to ED and home care instructions discussed. All questions answered.    Final Clinical Impressions(s) / ED Diagnoses   Final diagnoses:  Muscle strain    New Prescriptions New Prescriptions   HYDROCODONE-ACETAMINOPHEN (NORCO/VICODIN) 5-325 MG TABLET    Take 1 tablet by mouth every 6 (six) hours as needed for severe pain.   METHOCARBAMOL (ROBAXIN) 500 MG TABLET    Take 1 tablet (500 mg total) by mouth 2 (two) times daily as needed for muscle spasms.     Teagen Mcleary, Chase PicketJaime Pilcher, PA-C 10/25/16 [id number]    Lorre NickAllen, Anthony, MD  10/28/16 2352  

## 2016-10-25 NOTE — ED Triage Notes (Signed)
Pt started that he was working in his yard on Sunday and felt something pop. Pt states that he has rt neck pain that radiates to his shoulder.

## 2016-10-25 NOTE — ED Notes (Signed)
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

## 2016-10-25 NOTE — Discharge Instructions (Signed)
It was my pleasure taking care of you today!   Continue ice/heat. Rest. Continue ibuprofen as needed for mild to moderate pain. You should take NO MORE than 800mg  3x per day. Robaxin is your muscle relaxer to take as needed. Vicodin as needed for severe pain -This can make you very drowsy - please do not drink alcohol, operate heavy machinery or drive on this medication.   If your symptoms do not improve, please follow up with your primary care doctor or the orthopedist listed.   Return to ER for new or worsening symptoms, any additional concerns.

## 2017-02-21 ENCOUNTER — Emergency Department (HOSPITAL_COMMUNITY): Payer: BLUE CROSS/BLUE SHIELD

## 2017-02-21 ENCOUNTER — Emergency Department (HOSPITAL_COMMUNITY)
Admission: EM | Admit: 2017-02-21 | Discharge: 2017-02-21 | Disposition: A | Discharge status: Home / Self Care | Payer: BLUE CROSS/BLUE SHIELD | Attending: Emergency Medicine | Admitting: Emergency Medicine

## 2017-02-21 ENCOUNTER — Encounter (HOSPITAL_COMMUNITY): Payer: Self-pay | Admitting: *Deleted

## 2017-02-21 DIAGNOSIS — M25552 Pain in left hip: Secondary | ICD-10-CM | POA: Insufficient documentation

## 2017-02-21 DIAGNOSIS — Z79899 Other long term (current) drug therapy: Secondary | ICD-10-CM | POA: Diagnosis not present

## 2017-02-21 DIAGNOSIS — F17210 Nicotine dependence, cigarettes, uncomplicated: Secondary | ICD-10-CM | POA: Insufficient documentation

## 2017-02-21 DIAGNOSIS — M5442 Lumbago with sciatica, left side: Secondary | ICD-10-CM | POA: Diagnosis not present

## 2017-02-21 DIAGNOSIS — F909 Attention-deficit hyperactivity disorder, unspecified type: Secondary | ICD-10-CM | POA: Insufficient documentation

## 2017-02-21 DIAGNOSIS — M545 Low back pain: Secondary | ICD-10-CM | POA: Diagnosis present

## 2017-02-21 MED ORDER — CYCLOBENZAPRINE HCL 10 MG PO TABS: 10.0000 mg | ORAL_TABLET | Freq: Two times a day (BID) | ORAL | 0 refills | Status: DC | PRN

## 2017-02-21 MED ORDER — PREDNISONE 10 MG PO TABS: 20.0000 mg | ORAL_TABLET | Freq: Two times a day (BID) | ORAL | 0 refills | Status: DC

## 2017-02-21 MED ORDER — KETOROLAC TROMETHAMINE 15 MG/ML IJ SOLN
15.0000 mg | Freq: Once | INTRAMUSCULAR | Status: AC
  Administered 2017-02-21: 15 mg via INTRAMUSCULAR
  Filled 2017-02-21: qty 1

## 2017-02-21 MED ORDER — CYCLOBENZAPRINE HCL 10 MG PO TABS
10.0000 mg | ORAL_TABLET | Freq: Once | ORAL | Status: AC
  Administered 2017-02-21: 10 mg via ORAL
  Filled 2017-02-21: qty 1

## 2017-02-21 MED ORDER — ACETAMINOPHEN 500 MG PO TABS
1000.0000 mg | ORAL_TABLET | Freq: Once | ORAL | Status: AC
  Administered 2017-02-21: 1000 mg via ORAL
  Filled 2017-02-21: qty 2

## 2017-02-21 NOTE — Discharge Instructions (Signed)
Please see the information and instructions below regarding your visit.  Your diagnoses today include:  1. Acute left-sided low back pain with left-sided sciatica    About diagnosis. Most episodes of acute low back pain are self-limited. Your exam was reassuring today that the source of your pain is not affecting the spinal cord and nerves that originate in the spinal cord.   If you have a history of disc herniation or arthritis in your spine, the nerves exiting the spine on one side get inflamed. This can cause severe pain. We call this radiculopathy. We do not always know what causes the sudden inflammation.  Tests performed today include: See side panel of your discharge paperwork for testing performed today. Vital signs are listed at the bottom of these instructions.   Lumbar spine xray  Medications prescribed:    Take any prescribed medications only as prescribed, and any over the counter medications only as directed on the packaging.  You are prescribed prednisone, a steroid in the ED for sciatica. This is a medication to help reduce inflammation in the spine.  Common side effects include upset stomach/nausea. You may take this medicine with food if this occurs. Other side effects include restlessness, difficulty sleeping, and increased sweating. Call your healthcare provider if these do not resolve after finishing the medication.  This medicine may increase your blood sugar so additional careful monitoring is needed of blood sugar if you have diabetes. Call your healthcare provider for any signs/symtpoms of high blood sugar such as confusion, feeling sleepy, more thirst, more hunger, passing urine more often, flushing, fast breathing, or breath that smells like fruit.  You are prescribed Flexeril, a muscle relaxant. Some common side effects of this medication include:  Feeling sleepy.  Dizziness. Take care upon going from a seated to a standing position.  Dry mouth.  Feeling tired  or weak.  Hard stools (constipation).  Upset stomach. These are not all of the side effects that may occur. If you have questions about side effects, call your doctor. Call your primary care provider for medical advice about side effects.  This medication can be sedating. Only take this medication as needed. Please do not combine with alcohol. Do not drive or operate machinery while taking this medication.   This medication can interact with some other medications. Make sure to tell any provider you are taking this medication before they prescribe you a new medication.    Home care instructions:   Low back pain gets worse the longer you stay stationary. Please keep moving and walking as tolerated. There are exercises included in this packet to perform as tolerated for your low back pain.   Apply heat to the areas that are painful. Avoid twisting or bending your trunk to lift something. Do not lift anything above 25 lbs while recovering from this flare of low back pain.  Please follow any educational materials contained in this packet.   Follow-up instructions: Please follow-up with your primary care provider in  one week for further evaluation of your symptoms if they are not completely improved. I provided a list of providers for you to establish care.  Return instructions:  Please return to the Emergency Department if you experience worsening symptoms.  Please return for any fever or chills in the setting of your back pain, weakness in the muscles of the legs, numbness in your legs and feet that is new or changing, numbness in the area where you wipe, retention of your urine,  loss of bowel or bladder control, or problems with walking. Please return if you have any other emergent concerns.  Additional Information:   Your vital signs today were: BP 129/86    Pulse (!) 58    Temp 97.9 F (36.6 C) (Oral)    Resp 18    Ht 5\' 8"  (1.727 m)    Wt 106.6 kg (235 lb)    SpO2 96%    BMI 35.73  kg/m  If your blood pressure (BP) was elevated on multiple readings during this visit above 130 for the top number or above 80 for the bottom number, please have this repeated by your primary care provider within one month. --------------  Thank you for allowing us to participate in your care today.

## 2017-02-21 NOTE — ED Provider Notes (Signed)
[name]   CSN: [id number] Arrival date & time: 02/21/17  [id number]     History   Chief Complaint Chief Complaint  Patient presents with  . Back Pain    HPI Dustin Rhodes is a 31 y.o. male.  HPI   Patient is a 31 year old male with a history of acid reflux and ADHD presenting for 4 days of lower back pain.  Patient reports that initially was in the bilateral paraspinal muscular regions, however within the last 48 hours it was more focused into the left paraspinal musculature, left hip and down into his left leg.  No injury preceding this event.  Patient reports he has had some intermittent numbness in his left thigh.  Movement exacerbates the pain radiating down his left leg.  Patient ports the pain is his left hip has not been associated with red, hot, or swollen joint.  Patient denies any dropfoot, muscular weakness in lower extremities, numbness affecting bilateral distal lower extremities, loss of bowel or bladder control, saddle anesthesia, fever, chills, IV drug use, or history of cancer.  Patient denies dysuria, flank pain, or abdominal pain.  Patient has been trying Aleve for his symptoms without full relief.  Past Medical History:  Diagnosis Date  . Acid reflux   . ADHD (attention deficit hyperactivity disorder)     There are no active problems to display for this patient.   Past Surgical History:  Procedure Laterality Date  . TYMPANOPLASTY    . WISDOM TOOTH EXTRACTION         Home Medications    Prior to Admission medications   Medication Sig Start Date End Date Taking? Authorizing Provider  Cyanocobalamin (VITAMIN B 12 PO) Take 1 tablet daily by mouth.   Yes [provider]  naproxen sodium (ALEVE) 220 MG tablet Take 220 mg daily as needed by mouth.   Yes [provider]  niacin 500 MG tablet Take 500 mg at bedtime by mouth.   Yes [provider]  ranitidine (ZANTAC) 150 MG  tablet Take 150 mg daily by mouth.   Yes [provider]  HYDROcodone-acetaminophen (NORCO/VICODIN) 5-325 MG tablet Take 1 tablet by mouth every 6 (six) hours as needed for severe pain. Patient not taking: Reported on 02/21/2017 10/25/16   Ward, Chase PicketJaime Pilcher, PA-C  methocarbamol (ROBAXIN) 500 MG tablet Take 1 tablet (500 mg total) by mouth 2 (two) times daily as needed for muscle spasms. Patient not taking: Reported on 02/21/2017 10/25/16   Ward, Chase PicketJaime Pilcher, PA-C    Family History Family History  Problem Relation Age of Onset  . Asthma Mother   . Migraines Mother   . Cancer Other   . Heart attack Other   . Alzheimer's disease Other     Social History Social History   Tobacco Use  . Smoking status: Current Every Day Smoker    Packs/day: 1.00    Years: 10.00    Pack years: 10.00    Types: Cigarettes  . Smokeless tobacco: Former Engineer, waterUser  Substance Use Topics  . Alcohol use: Yes    Comment: once a month/beer  . Drug use: No     Allergies   Goodys body pain [aspirin-acetaminophen] and Tramadol   Review of Systems Review of Systems  Constitutional: Negative for chills and fever.  HENT: Negative for congestion, rhinorrhea and sore throat.   Respiratory: Negative for cough and shortness of breath.   Cardiovascular: Negative for chest pain and leg  swelling.  Gastrointestinal: Negative for abdominal pain, nausea and vomiting.  Genitourinary: Negative for dysuria and flank pain.  Musculoskeletal: Positive for back pain.  Skin: Negative for rash.  Neurological: Positive for numbness. Negative for weakness and headaches.     Physical Exam Updated Vital Signs BP 131/89 (BP Location: Left Arm)   Pulse (!) 113   Resp 18   Ht 5\' 8"  (1.727 m)   Wt 106.6 kg (235 lb)   SpO2 96%   BMI 35.73 kg/m   Physical Exam  Constitutional: He appears well-developed and well-nourished. No distress.  HENT:  Head: Normocephalic and atraumatic.  Mouth/Throat: Oropharynx is clear  and moist.  Eyes: Conjunctivae and EOM are normal. Pupils are equal, round, and reactive to light.  Neck: Normal range of motion. Neck supple.  Cardiovascular: Normal rate, regular rhythm, S1 normal and S2 normal.  No murmur heard. Pulmonary/Chest: Effort normal and breath sounds normal. He has no wheezes. He has no rales.  Abdominal: Soft. He exhibits no distension. There is no tenderness. There is no guarding.  Musculoskeletal: Normal range of motion. He exhibits no edema or deformity.  Tenderness to palpation along IT band but no bony focal tenderness over hip or greater trochanter.  Lymphadenopathy:    He has no cervical adenopathy.  Neurological: He is alert.  Spine Exam: Inspection/Palpation: No midline tenderness of cervical, thoracic, or lumbar spine.  Trigger point palpated in the left paraspinal musculature is focus of pain.  No right paraspinal muscular tenderness. Strength: 5/5 throughout LE bilaterally (hip flexion/extension, adduction/abduction; knee flexion/extension; foot dorsiflexion/plantarflexion, inversion/eversion; great toe inversion) Sensation: Intact to light touch in proximal and distal LE bilaterally Reflexes: 2+ quadriceps and achilles reflexes.  No clonus. Patient exhibits an antalgic gait with discomfort in left hip, however hip flexion with walking is symmetric and coordinated.  Skin: Skin is warm and dry. No rash noted. No erythema.  Psychiatric: He has a normal mood and affect. His behavior is normal. Judgment and thought content normal.  Nursing note and vitals reviewed.    ED Treatments / Results  Labs (all labs ordered are listed, but only abnormal results are displayed) Labs Reviewed - No data to display  EKG  EKG Interpretation None       Radiology No results found.  Procedures Procedures (including critical care time)  Medications Ordered in ED Medications  ketorolac (TORADOL) 15 MG/ML injection 15 mg (not administered)  acetaminophen  (TYLENOL) tablet 1,000 mg (not administered)  cyclobenzaprine (FLEXERIL) tablet 10 mg (not administered)     Initial Impression / Assessment and Plan / ED Course  I have reviewed the triage vital signs and the nursing notes.  Pertinent labs & imaging results that were available during my care of the patient were reviewed by me and considered in my medical decision making (see chart for details).  Clinical Course as of Feb 22 1055  Tue Feb 21, 2017  1056 Patient verbally verified a safe ride from the ED. Proceeded with prescribing Flexeril for pain/relaxtion/muscle relaxation in the ED.   [AM]    Clinical Course User Index [AM] Elisha PonderMurray, Yashica Sterbenz B, PA-C    Final Clinical Impressions(s) / ED Diagnoses   Final diagnoses:  None   Differential diagnosis includes sciatica, neuralgia paresthetica, lumbar musculature strain. patient denies any concerning symptoms suggestive of cauda equina requiring urgent imaging at this time such as loss of sensation in the lower extremities, lower extremity weakness, loss of bowel or bladder control, saddle anesthesia, urinary retention, fever/chills,  IVDU. Exam demonstrated no  weakness on exam today. No preceding injury or trauma to suggest acute fracture.  Exam and history are consistent with sciatica.  Doubt pelvic or urinary pathology for patient's acute back pain, as patient denies urinary symptoms, history/pain not consistent with nephrolithiasis. Patient given strict return precautions for any symptoms indicating worsening neurologic function in the lower extremities.  Pulse was rechecked and evaluation, and noted to be 64. Nursing notes reviewed. Vital signs reviewed. All questions answered by patient.  This is a supervised visit with Dr. Melene Plan. Evaluation, management, and discharge planning discussed with this attending physician.  ED Discharge Orders    None       Delia Chimes 02/21/17 1227    Melene Plan, DO 02/21/17 1440

## 2017-02-21 NOTE — ED Notes (Signed)
Patient transported to X-ray 

## 2017-02-21 NOTE — ED Notes (Signed)
ED Provider at bedside. 

## 2017-02-21 NOTE — ED Triage Notes (Signed)
C/o lower back pain onset several days ago ,states he rested this weekend it felt better, today states pain radiated down his left leg.

## 2017-06-15 ENCOUNTER — Emergency Department (HOSPITAL_COMMUNITY)
Admission: EM | Admit: 2017-06-15 | Discharge: 2017-06-15 | Disposition: A | Discharge status: Home / Self Care | Payer: BLUE CROSS/BLUE SHIELD | Attending: Emergency Medicine | Admitting: Emergency Medicine

## 2017-06-15 ENCOUNTER — Encounter (HOSPITAL_COMMUNITY): Payer: Self-pay

## 2017-06-15 DIAGNOSIS — Y999 Unspecified external cause status: Secondary | ICD-10-CM | POA: Diagnosis not present

## 2017-06-15 DIAGNOSIS — S0502XA Injury of conjunctiva and corneal abrasion without foreign body, left eye, initial encounter: Secondary | ICD-10-CM | POA: Diagnosis not present

## 2017-06-15 DIAGNOSIS — F17210 Nicotine dependence, cigarettes, uncomplicated: Secondary | ICD-10-CM | POA: Diagnosis not present

## 2017-06-15 DIAGNOSIS — Z23 Encounter for immunization: Secondary | ICD-10-CM | POA: Insufficient documentation

## 2017-06-15 DIAGNOSIS — Z79899 Other long term (current) drug therapy: Secondary | ICD-10-CM | POA: Diagnosis not present

## 2017-06-15 DIAGNOSIS — W548XXA Other contact with dog, initial encounter: Secondary | ICD-10-CM | POA: Diagnosis not present

## 2017-06-15 DIAGNOSIS — Y939 Activity, unspecified: Secondary | ICD-10-CM | POA: Diagnosis not present

## 2017-06-15 DIAGNOSIS — S0500XA Injury of conjunctiva and corneal abrasion without foreign body, unspecified eye, initial encounter: Secondary | ICD-10-CM

## 2017-06-15 DIAGNOSIS — Y929 Unspecified place or not applicable: Secondary | ICD-10-CM | POA: Diagnosis not present

## 2017-06-15 DIAGNOSIS — S0592XA Unspecified injury of left eye and orbit, initial encounter: Secondary | ICD-10-CM | POA: Diagnosis present

## 2017-06-15 MED ORDER — TOBRAMYCIN 0.3 % OP SOLN: 2.0000 [drp] | OPHTHALMIC | 0 refills | Status: DC

## 2017-06-15 MED ORDER — TETANUS-DIPHTH-ACELL PERTUSSIS 5-2.5-18.5 LF-MCG/0.5 IM SUSP
0.5000 mL | Freq: Once | INTRAMUSCULAR | Status: AC
  Administered 2017-06-15: 0.5 mL via INTRAMUSCULAR
  Filled 2017-06-15: qty 0.5

## 2017-06-15 MED ORDER — ACETAMINOPHEN 325 MG PO TABS
650.0000 mg | ORAL_TABLET | Freq: Once | ORAL | Status: AC
  Administered 2017-06-15: 650 mg via ORAL
  Filled 2017-06-15: qty 2

## 2017-06-15 MED ORDER — ERYTHROMYCIN 5 MG/GM OP OINT
TOPICAL_OINTMENT | Freq: Once | OPHTHALMIC | Status: AC
  Administered 2017-06-15: 08:00:00 via OPHTHALMIC
  Filled 2017-06-15: qty 3.5

## 2017-06-15 MED ORDER — FLUORESCEIN SODIUM 1 MG OP STRP
ORAL_STRIP | OPHTHALMIC | Status: AC
  Filled 2017-06-15: qty 1

## 2017-06-15 MED ORDER — HYDROCODONE-ACETAMINOPHEN 5-325 MG PO TABS: 1.0000 | ORAL_TABLET | Freq: Four times a day (QID) | ORAL | 0 refills | Status: DC | PRN

## 2017-06-15 MED ORDER — TETRACAINE HCL 0.5 % OP SOLN
OPHTHALMIC | Status: AC
  Filled 2017-06-15: qty 4

## 2017-06-15 NOTE — ED Provider Notes (Signed)
[facility] EMERGENCY DEPARTMENT Provider Note   CSN: [id number] Arrival date & time: 06/15/17  4098     History   Chief Complaint Chief Complaint  Patient presents with  . Eye Pain    HPI Dustin Rhodes is a 32 y.o. male.  Patient complains of left eye pain.  His dog scratched his eye   The history is provided by the patient. No language interpreter was used.  Eye Pain  This is a new problem. The current episode started 1 to 2 hours ago. The problem occurs constantly. The problem has not changed since onset.Pertinent negatives include no chest pain, no abdominal pain and no headaches. Nothing aggravates the symptoms. Nothing relieves the symptoms.    Past Medical History:  Diagnosis Date  . Acid reflux   . ADHD (attention deficit hyperactivity disorder)     There are no active problems to display for this patient.   Past Surgical History:  Procedure Laterality Date  . TYMPANOPLASTY    . WISDOM TOOTH EXTRACTION         Home Medications    Prior to Admission medications   Medication Sig Start Date End Date Taking? Authorizing Provider  Cyanocobalamin (VITAMIN B 12 PO) Take 1 tablet daily by mouth.    [provider]  cyclobenzaprine (FLEXERIL) 10 MG tablet Take 1 tablet (10 mg total) 2 (two) times daily as needed by mouth for muscle spasms. 02/21/17   Aviva Kluver B, PA-C  HYDROcodone-acetaminophen (NORCO/VICODIN) 5-325 MG tablet Take 1 tablet by mouth every 6 (six) hours as needed for moderate pain. 06/15/17   Bethann Berkshire, MD  methocarbamol (ROBAXIN) 500 MG tablet Take 1 tablet (500 mg total) by mouth 2 (two) times daily as needed for muscle spasms. Patient not taking: Reported on 02/21/2017 10/25/16   Ward, Chase Picket, PA-C  naproxen sodium (ALEVE) 220 MG tablet Take 220 mg daily as needed by mouth.    [provider]  niacin 500 MG tablet Take 500 mg at bedtime by mouth.    [provider]  predniSONE (DELTASONE) 10 MG  tablet Take 2 tablets (20 mg total) 2 (two) times daily with a meal by mouth. 02/21/17   Aviva Kluver B, PA-C  ranitidine (ZANTAC) 150 MG tablet Take 150 mg daily by mouth.    [provider]  tobramycin (TOBREX) 0.3 % ophthalmic solution Place 2 drops into the left eye every 4 (four) hours. 06/15/17   Bethann Berkshire, MD    Family History Family History  Problem Relation Age of Onset  . Asthma Mother   . Migraines Mother   . Cancer Other   . Heart attack Other   . Alzheimer's disease Other     Social History Social History   Tobacco Use  . Smoking status: Current Every Day Smoker    Packs/day: 1.00    Years: 10.00    Pack years: 10.00    Types: Cigarettes  . Smokeless tobacco: Former Engineer, water Use Topics  . Alcohol use: Yes    Comment: once a month/beer  . Drug use: No     Allergies   Goodys body pain [aspirin-acetaminophen] and Tramadol   Review of Systems Review of Systems  Constitutional: Negative for appetite change and fatigue.  HENT: Negative for congestion, ear discharge and sinus pressure.   Eyes: Positive for pain. Negative for discharge.  Respiratory: Negative for cough.   Cardiovascular: Negative for chest pain.  Gastrointestinal: Negative for abdominal pain and diarrhea.  Genitourinary: Negative for frequency and hematuria.  Musculoskeletal: Negative for back pain.  Skin: Negative for rash.  Neurological: Negative for seizures and headaches.  Psychiatric/Behavioral: Negative for hallucinations.     Physical Exam Updated Vital Signs BP (!) 153/106 (BP Location: Right Arm)   Pulse 83   Temp 97.8 F (36.6 C) (Oral)   Ht 5\' 8"  (1.727 m)   Wt 108.9 kg (240 lb)   SpO2 100%   BMI 36.49 kg/m   Physical Exam  Constitutional: He is oriented to person, place, and time. He appears well-developed.  HENT:  Head: Normocephalic.  Left eye conjunctiva inflamed.  Vision slightly blurry.  Floor seen shows patient to have a large corneal  abrasion to that left eye  Eyes: Conjunctivae and EOM are normal. No scleral icterus.  Neck: Neck supple. No thyromegaly present.  Cardiovascular: Normal rate and regular rhythm. Exam reveals no gallop and no friction rub.  No murmur heard. Pulmonary/Chest: No stridor. He has no wheezes. He has no rales. He exhibits no tenderness.  Abdominal: He exhibits no distension. There is no tenderness. There is no rebound.  Musculoskeletal: Normal range of motion. He exhibits no edema.  Lymphadenopathy:    He has no cervical adenopathy.  Neurological: He is oriented to person, place, and time. He exhibits normal muscle tone. Coordination normal.  Skin: No rash noted. No erythema.  Psychiatric: He has a normal mood and affect. His behavior is normal.     ED Treatments / Results  Labs (all labs ordered are listed, but only abnormal results are displayed) Labs Reviewed - No data to display  EKG  EKG Interpretation None       Radiology No results found.  Procedures Procedures (including critical care time)  Medications Ordered in ED Medications  fluorescein 1 MG ophthalmic strip (not administered)  tetracaine (PONTOCAINE) 0.5 % ophthalmic solution (not administered)  erythromycin ophthalmic ointment ( Left Eye Given 06/15/17 [id number])  acetaminophen (TYLENOL) tablet 650 mg (650 mg Oral Given 06/15/17 [id number])     Initial Impression / Assessment and Plan / ED Course  I have reviewed the triage vital signs and the nursing notes.  Pertinent labs & imaging results that were available during my care of the patient were reviewed by me and considered in my medical decision making (see chart for details).     Patient with large corneal abrasions left eye.  He is getting antibiotic ointment to his eye with the patch and then will start putting drops in tomorrow he is also given pain medicine and is referred to ophthalmology or he can follow-up with optometry Final Clinical Impressions(s) / ED  Diagnoses   Final diagnoses:  Corneal abrasion, unspecified laterality, initial encounter    ED Discharge Orders        Ordered    tobramycin (TOBREX) 0.3 % ophthalmic solution  Every 4 hours     06/15/17 0926    HYDROcodone-acetaminophen (NORCO/VICODIN) 5-325 MG tablet  Every 6 hours PRN     06/15/17 0926       Bethann BerkshireZammit, Faron Tudisco, MD 06/15/17 [phone]

## 2017-06-15 NOTE — Discharge Instructions (Signed)
Start using the drops tomorrow and use them every 4 hours while awake.  [place] [place]  Or [place].  You need to be followed up on Monday so call and get the appointment set up.

## 2017-10-29 ENCOUNTER — Emergency Department (HOSPITAL_COMMUNITY)
Admission: EM | Admit: 2017-10-29 | Discharge: 2017-10-29 | Disposition: A | Discharge status: Home / Self Care | Payer: BLUE CROSS/BLUE SHIELD | Attending: Emergency Medicine | Admitting: Emergency Medicine

## 2017-10-29 ENCOUNTER — Encounter (HOSPITAL_COMMUNITY): Payer: Self-pay

## 2017-10-29 ENCOUNTER — Other Ambulatory Visit: Payer: Self-pay

## 2017-10-29 DIAGNOSIS — Y939 Activity, unspecified: Secondary | ICD-10-CM | POA: Insufficient documentation

## 2017-10-29 DIAGNOSIS — F17210 Nicotine dependence, cigarettes, uncomplicated: Secondary | ICD-10-CM | POA: Insufficient documentation

## 2017-10-29 DIAGNOSIS — Y999 Unspecified external cause status: Secondary | ICD-10-CM | POA: Insufficient documentation

## 2017-10-29 DIAGNOSIS — S1189XA Other open wound of other specified part of neck, initial encounter: Secondary | ICD-10-CM | POA: Insufficient documentation

## 2017-10-29 DIAGNOSIS — Y929 Unspecified place or not applicable: Secondary | ICD-10-CM | POA: Insufficient documentation

## 2017-10-29 DIAGNOSIS — Z79899 Other long term (current) drug therapy: Secondary | ICD-10-CM | POA: Diagnosis not present

## 2017-10-29 DIAGNOSIS — X58XXXA Exposure to other specified factors, initial encounter: Secondary | ICD-10-CM | POA: Diagnosis not present

## 2017-10-29 DIAGNOSIS — S1190XA Unspecified open wound of unspecified part of neck, initial encounter: Secondary | ICD-10-CM

## 2017-10-29 NOTE — ED Triage Notes (Signed)
Pt has tunneling abscess to left neck states it has been there for about a year, open for 3 weeks with drainage, and stinging for the past few days.Pt also reports oral abscess about a year ago when this all started. Pt states concern for staph infection.

## 2017-10-29 NOTE — ED Notes (Signed)
Patient verbalizes understanding of discharge instructions. Opportunity for questioning and answers were provided. Armband removed by staff, pt discharged from ED ambulatory.   

## 2017-10-29 NOTE — Discharge Instructions (Addendum)
You must follow-up with a medical provider, your doctor or urgent care or emergency department to make sure that your wound is healing within the next week or 2. Your wound does not show signs of infection at this time.  Please allow this area to heal. Please use proper wound care. Do not pick off your scab, please stop using peroxide/rubbing alcohol daily on the wound.  Please use sterile gauze dressing on the wound and change it daily.  Please keep the area clean, dry and covered. Please return to the emergency department for any new or worsening symptoms.  Contact a health care provider if: Your pain is not controlled with over-the-counter medicines You have more redness, swelling, or pain around the wound. You have more fluid or blood coming from the wound. Your wound feels warm to the touch. You have pus or a bad smell coming from the wound. You have a fever or chills. You are nauseous or you vomit. You are dizzy. Get help right away if: You have a red streak going away from your wound. You develop a fever You have trouble swallowing or moving your neck The edges of the wound open up and separate. Your wound is bleeding and the bleeding does not stop with gentle pressure. You have a rash. You faint. You have trouble breathing.

## 2017-10-29 NOTE — ED Provider Notes (Signed)
Medical screening examination/treatment/procedure(s) were conducted as a shared visit with non-physician practitioner(s) and myself.  I personally evaluated the patient during the encounter.  None  31yM with wound to L neck. Reports at site of previous abscess. No signs of infection currently. He has about a dime sized superficial ulceration. Border a little hypertrophic/raised. He has been cleaning it regularly with peroxide or alcohol. Advised to stop. He can clean it gently with mild soap and warm water but otherwise minimize irritation. I am somewhat concerned about the chronicity of this wound. Advised that it doesn't begin to progressively heal within the next few weeks that he needs to have it rechecked and likely biopsied to r/o malignancy.    Raeford RazorKohut, Louanne Calvillo, MD 10/29/17 [phone]

## 2017-10-29 NOTE — ED Provider Notes (Signed)
[facility]   CSN: [id number] Arrival date & time: 10/29/17  1615     History   Chief Complaint Chief Complaint  Patient presents with  . Abscess    HPI Bridget HartshornCasey L Rhodes is a 32 y.o. male presenting for wound to the left side of his neck.  Patient states that presently 1 year ago he had an abscess to this area which was treated with antibiotics.  Patient states that he healed well from this however he developed a knot in the area.  Patient states that 3 weeks ago he was sleeping and scratched the knot off.  Patient endorses a burning pain to the area, 1/10 in severity.  Patient states that he has been applying peroxide and rubbing alcohol to the area daily along with Neosporin and rinsing it in the shower.  Patient states that the area occasionally develops a scab but the scab washes off with the shower and rubbing alcohol applications.  Patient states that he has a a minor amount of clear drainage from the wound on his Band-Aids, denies purulent drainage.  Patient denies change in size to the area.  Patient denies history of fever, nausea, vomiting, abdominal pain, diarrhea, chest pain, shortness of breath, decreased range of motion of the neck, pain with range of motion of the neck, trouble swallowing.  HPI  Past Medical History:  Diagnosis Date  . Acid reflux   . ADHD (attention deficit hyperactivity disorder)     There are no active problems to display for this patient.   Past Surgical History:  Procedure Laterality Date  . TYMPANOPLASTY    . WISDOM TOOTH EXTRACTION          Home Medications    Prior to Admission medications   Medication Sig Start Date End Date Taking? Authorizing Provider  Cyanocobalamin (VITAMIN B 12 PO) Take 1 tablet daily by mouth.    [provider]  cyclobenzaprine (FLEXERIL) 10 MG tablet Take 1 tablet (10 mg total) 2 (two) times daily as needed by mouth for muscle spasms. 02/21/17    Aviva KluverMurray, Alyssa B, PA-C  HYDROcodone-acetaminophen (NORCO/VICODIN) 5-325 MG tablet Take 1 tablet by mouth every 6 (six) hours as needed for moderate pain. 06/15/17   Bethann BerkshireZammit, Joseph, MD  methocarbamol (ROBAXIN) 500 MG tablet Take 1 tablet (500 mg total) by mouth 2 (two) times daily as needed for muscle spasms. Patient not taking: Reported on 02/21/2017 10/25/16   Ward, Chase PicketJaime Pilcher, PA-C  naproxen sodium (ALEVE) 220 MG tablet Take 220 mg daily as needed by mouth.    [provider]  niacin 500 MG tablet Take 500 mg at bedtime by mouth.    [provider]  predniSONE (DELTASONE) 10 MG tablet Take 2 tablets (20 mg total) 2 (two) times daily with a meal by mouth. 02/21/17   Aviva KluverMurray, Alyssa B, PA-C  ranitidine (ZANTAC) 150 MG tablet Take 150 mg daily by mouth.    [provider]  tobramycin (TOBREX) 0.3 % ophthalmic solution Place 2 drops into the left eye every 4 (four) hours. 06/15/17   Bethann BerkshireZammit, Joseph, MD    Family History Family History  Problem Relation Age of Onset  . Asthma Mother   . Migraines Mother   . Cancer Other   . Heart attack Other   . Alzheimer's disease Other     Social History Social History   Tobacco Use  . Smoking status: Current Every Day Smoker    Packs/day:  1.00    Years: 10.00    Pack years: 10.00    Types: Cigarettes  . Smokeless tobacco: Former Engineer, water Use Topics  . Alcohol use: Yes    Comment: once a month/beer  . Drug use: No     Allergies   Goodys body pain [aspirin-acetaminophen] and Tramadol   Review of Systems Review of Systems  Constitutional: Negative.  Negative for chills, fatigue and fever.  HENT: Negative.  Negative for congestion, dental problem, rhinorrhea, sore throat and trouble swallowing.   Eyes: Negative.  Negative for visual disturbance.  Respiratory: Negative.  Negative for cough, chest tightness and shortness of breath.   Cardiovascular: Negative.  Negative for chest pain and leg swelling.    Gastrointestinal: Negative.  Negative for abdominal pain, blood in stool, diarrhea, nausea and vomiting.  Genitourinary: Negative.  Negative for dysuria, flank pain and hematuria.  Musculoskeletal: Negative.  Negative for arthralgias, myalgias and neck pain.  Skin: Positive for wound. Negative for rash.  Neurological: Negative.  Negative for dizziness, syncope, weakness, light-headedness and headaches.     Physical Exam Updated Vital Signs BP (!) 136/99   Pulse 87   Temp 98.4 F (36.9 C) (Oral)   Resp 16   Ht 5\' 8"  (1.727 m)   Wt 106.6 kg (235 lb)   SpO2 97%   BMI 35.73 kg/m   Physical Exam  Constitutional: He is oriented to person, place, and time. He appears well-developed and well-nourished. No distress.  HENT:  Head: Normocephalic and atraumatic.  Right Ear: External ear normal.  Left Ear: External ear normal.  Nose: Nose normal.  Mouth/Throat: Uvula is midline, oropharynx is clear and moist and mucous membranes are normal. No oral lesions. No dental abscesses. No oropharyngeal exudate, posterior oropharyngeal edema or posterior oropharyngeal erythema.  Patient able to swallow without difficulty or pain.  Eyes: Pupils are equal, round, and reactive to light. EOM are normal.  Neck: Trachea normal, normal range of motion, full passive range of motion without pain and phonation normal. Neck supple. No JVD present. No tracheal tenderness, no spinous process tenderness and no muscular tenderness present. No neck rigidity. No tracheal deviation and normal range of motion present.    Patient with 2 cm x 6 mm well-healing wound approximately 4 mm in depth.  No surrounding erythema, no fluctuance, no streaking, no active drainage, no bleeding, no tenderness to palpation.  Some granulation tissue noted in the wound.  Cardiovascular: Normal rate, regular rhythm and normal heart sounds.  Pulmonary/Chest: Effort normal and breath sounds normal. No respiratory distress.  Abdominal: Soft.  There is no tenderness. There is no rebound and no guarding.  Musculoskeletal: Normal range of motion. He exhibits no deformity.  Neurological: He is alert and oriented to person, place, and time. No cranial nerve deficit or sensory deficit.  Skin: Skin is warm and dry.  Psychiatric: He has a normal mood and affect. His behavior is normal.     ED Treatments / Results  Labs (all labs ordered are listed, but only abnormal results are displayed) Labs Reviewed - No data to display  EKG None  Radiology No results found.  Procedures Procedures (including critical care time)  Medications Ordered in ED Medications - No data to display   Initial Impression / Assessment and Plan / ED Course  I have reviewed the triage vital signs and the nursing notes.  Pertinent labs & imaging results that were available during my care of the patient were reviewed by  me and considered in my medical decision making (see chart for details).   32 year old male presenting with wound to the left side of his neck.  No signs of infection at this time, 2 cm x 6 mm area of ulceration approximately 4 mm in depth.  Patient has been cleaning the area with peroxide and alcohol daily.  Patient has been advised to stop using these chemicals on the area and to clean the area with soap and water.  Patient has been given wound care instructions and has been advised to follow-up with a medical provider within the next 2 weeks.  Patient seen by Dr. Juleen China who does not advise antibiotic therapy at this time. Patient states understanding of importance of follow-up within the next 2 weeks to ensure wound healing.  Patient given extensive return precautions and states understanding of signs of infection to look for.  At this time there does not appear to be any evidence of an acute emergency medical condition and the patient appears stable for discharge with appropriate outpatient follow up. Diagnosis was discussed with patient who  verbalizes understanding and is agreeable to discharge. I have discussed return precautions with patient who verbalizes understanding of return precautions. Patient strongly encouraged to follow-up with their PCP.  Patient's seen by and case discussed with Dr. Juleen China who agrees with plan to discharge with follow-up.     This note was dictated using DragonOne dictation software; please contact for any inconsistencies within the note.   Final Clinical Impressions(s) / ED Diagnoses   Final diagnoses:  Open wound of neck, initial encounter    ED Discharge Orders    None       Elizabeth Palau 10/29/17 2111    Raeford Razor, MD 10/30/17 1530

## 2017-10-29 NOTE — ED Notes (Signed)
ED Provider at bedside. 

## 2018-06-27 ENCOUNTER — Emergency Department (HOSPITAL_COMMUNITY): Payer: BLUE CROSS/BLUE SHIELD

## 2018-06-27 ENCOUNTER — Encounter (HOSPITAL_COMMUNITY): Payer: Self-pay | Admitting: Emergency Medicine

## 2018-06-27 ENCOUNTER — Other Ambulatory Visit: Payer: Self-pay

## 2018-06-27 ENCOUNTER — Emergency Department (HOSPITAL_COMMUNITY)
Admission: EM | Admit: 2018-06-27 | Discharge: 2018-06-27 | Disposition: A | Discharge status: Home / Self Care | Payer: BLUE CROSS/BLUE SHIELD | Attending: Emergency Medicine | Admitting: Emergency Medicine

## 2018-06-27 DIAGNOSIS — Z79899 Other long term (current) drug therapy: Secondary | ICD-10-CM | POA: Diagnosis not present

## 2018-06-27 DIAGNOSIS — Y9301 Activity, walking, marching and hiking: Secondary | ICD-10-CM | POA: Diagnosis not present

## 2018-06-27 DIAGNOSIS — Y999 Unspecified external cause status: Secondary | ICD-10-CM | POA: Insufficient documentation

## 2018-06-27 DIAGNOSIS — F17210 Nicotine dependence, cigarettes, uncomplicated: Secondary | ICD-10-CM | POA: Insufficient documentation

## 2018-06-27 DIAGNOSIS — Y929 Unspecified place or not applicable: Secondary | ICD-10-CM | POA: Diagnosis not present

## 2018-06-27 DIAGNOSIS — S4992XA Unspecified injury of left shoulder and upper arm, initial encounter: Secondary | ICD-10-CM | POA: Diagnosis present

## 2018-06-27 DIAGNOSIS — W1840XA Slipping, tripping and stumbling without falling, unspecified, initial encounter: Secondary | ICD-10-CM | POA: Insufficient documentation

## 2018-06-27 DIAGNOSIS — S46912A Strain of unspecified muscle, fascia and tendon at shoulder and upper arm level, left arm, initial encounter: Secondary | ICD-10-CM | POA: Insufficient documentation

## 2018-06-27 MED ORDER — METHOCARBAMOL 500 MG PO TABS: 500.0000 mg | ORAL_TABLET | Freq: Two times a day (BID) | ORAL | 0 refills | Status: DC

## 2018-06-27 MED ORDER — IBUPROFEN 800 MG PO TABS
800.0000 mg | ORAL_TABLET | Freq: Once | ORAL | Status: AC
  Administered 2018-06-27: 800 mg via ORAL
  Filled 2018-06-27: qty 1

## 2018-06-27 MED ORDER — IBUPROFEN 800 MG PO TABS: 800.0000 mg | ORAL_TABLET | Freq: Three times a day (TID) | ORAL | 0 refills | Status: DC

## 2018-06-27 NOTE — Discharge Instructions (Addendum)
See the Orthopaedist for recheck in 1 week.  Return if any problems.

## 2018-06-27 NOTE — ED Triage Notes (Signed)
Pt reports he tripped and caught himself on a banister, pt c/o left shoulder pain since, reports started out as mild pain last night and has intensified

## 2018-06-28 NOTE — ED Provider Notes (Signed)
[facility] EMERGENCY DEPARTMENT Provider Note   CSN: [id number] Arrival date & time: 06/27/18  1327    History   Chief Complaint Chief Complaint  Patient presents with  . Shoulder Pain    HPI Dustin Rhodes is a 33 y.o. male.     The history is provided by the patient. No language interpreter was used.  Shoulder Pain  Location:  Shoulder Shoulder location:  L shoulder Injury: yes   Time since incident:  1 day Mechanism of injury: fall   Fall:    Fall occurred:  Tripped   Height of fall:  Stair rail Pain details:    Quality:  Aching  Pt reports he tripped and caught himself.  Pt reports soreness in his elbow and pain in his left shoulder.  Pt points to anterior shoulder as area of pain.  Pain with movement   Past Medical History:  Diagnosis Date  . Acid reflux   . ADHD (attention deficit hyperactivity disorder)     There are no active problems to display for this patient.   Past Surgical History:  Procedure Laterality Date  . TYMPANOPLASTY    . WISDOM TOOTH EXTRACTION          Home Medications    Prior to Admission medications   Medication Sig Start Date End Date Taking? Authorizing Provider  Cyanocobalamin (VITAMIN B 12 PO) Take 1 tablet daily by mouth.    [provider]  ibuprofen (ADVIL,MOTRIN) 800 MG tablet Take 1 tablet (800 mg total) by mouth 3 (three) times daily. 06/27/18   Elson Areas, PA-C  methocarbamol (ROBAXIN) 500 MG tablet Take 1 tablet (500 mg total) by mouth 2 (two) times daily. 06/27/18   Elson Areas, PA-C  niacin 500 MG tablet Take 500 mg at bedtime by mouth.    [provider]    Family History Family History  Problem Relation Age of Onset  . Asthma Mother   . Migraines Mother   . Cancer Other   . Heart attack Other   . Alzheimer's disease Other     Social History Social History   Tobacco Use  . Smoking status: Current Every Day Smoker    Packs/day: 1.00    Years: 10.00    Pack years: 10.00     Types: Cigarettes  . Smokeless tobacco: Former Engineer, water Use Topics  . Alcohol use: Yes    Comment: once a month/beer  . Drug use: No     Allergies   Goodys body pain [aspirin-acetaminophen] and Tramadol   Review of Systems Review of Systems  Musculoskeletal: Positive for arthralgias.  All other systems reviewed and are negative.    Physical Exam Updated Vital Signs BP 128/87 (BP Location: Right Arm)   Pulse 74   Temp 98.2 F (36.8 C) (Oral)   Resp 16   Ht 5\' 8"  (1.727 m)   Wt 102.1 kg   SpO2 100%   BMI 34.21 kg/m   Physical Exam Vitals signs and nursing note reviewed.  Constitutional:      Appearance: He is well-developed.  HENT:     Head: Normocephalic.  Neck:     Musculoskeletal: Normal range of motion.  Cardiovascular:     Rate and Rhythm: Normal rate.  Pulmonary:     Effort: Pulmonary effort is normal.  Abdominal:     General: There is no distension.  Musculoskeletal: Normal range of motion.     Comments: Tender left anterior shoulder,  Pain with range of motion,  nv and ns intact  Skin:    General: Skin is warm.  Neurological:     General: No focal deficit present.     Mental Status: He is alert and oriented to person, place, and time.  Psychiatric:        Mood and Affect: Mood normal.      ED Treatments / Results  Labs (all labs ordered are listed, but only abnormal results are displayed) Labs Reviewed - No data to display  EKG None  Radiology Dg Elbow Complete Left  Result Date: 06/27/2018 CLINICAL DATA:  Elbow pain and swelling and limited range of motion secondary to a fall yesterday. EXAM: LEFT ELBOW - COMPLETE 3+ VIEW COMPARISON:  None. FINDINGS: There is no evidence of fracture, dislocation, or joint effusion. There is no evidence of arthropathy or other focal bone abnormality. Soft tissues are unremarkable. IMPRESSION: Negative. Electronically Signed   By: Francene Boyers M.D.   On: 06/27/2018 14:56   Dg Shoulder Left   Result Date: 06/27/2018 CLINICAL DATA:  Left shoulder pain since a fall yesterday. Swelling and limited range of motion. EXAM: LEFT SHOULDER - 2+ VIEW COMPARISON:  None. FINDINGS: There is no evidence of fracture or dislocation. There is no evidence of arthropathy or other focal bone abnormality. Soft tissues are unremarkable. IMPRESSION: Negative. Electronically Signed   By: Francene Boyers M.D.   On: 06/27/2018 14:56    Procedures Procedures (including critical care time)  Medications Ordered in ED Medications  ibuprofen (ADVIL,MOTRIN) tablet 800 mg (800 mg Oral Given 06/27/18 1621)     An After Visit Summary was printed and given to the patient. Initial Impression / Assessment and Plan / ED Course  I have reviewed the triage vital signs and the nursing notes.  Pertinent labs & imaging results that were available during my care of the patient were reviewed by me and considered in my medical decision making (see chart for details).        MDM  Xrays reviewed and discussed with pt,  Pt given sling.  Rx for metocarbamol and ibuprofen.  Pt advised to follow up with Dr. Romeo Apple if symptoms persist   Final Clinical Impressions(s) / ED Diagnoses   Final diagnoses:  Strain of left shoulder, initial encounter    ED Discharge Orders         Ordered    ibuprofen (ADVIL,MOTRIN) 800 MG tablet  3 times daily     06/27/18 1617    methocarbamol (ROBAXIN) 500 MG tablet  2 times daily     06/27/18 1617           Elson Areas, [place] 06/28/18 0040    Samuel Jester, DO 06/28/18 2236

## 2018-09-06 ENCOUNTER — Other Ambulatory Visit: Payer: Self-pay | Admitting: Internal Medicine

## 2018-09-06 ENCOUNTER — Other Ambulatory Visit: Payer: BLUE CROSS/BLUE SHIELD

## 2018-09-06 ENCOUNTER — Telehealth: Payer: Self-pay

## 2018-09-06 DIAGNOSIS — Z20822 Contact with and (suspected) exposure to covid-19: Secondary | ICD-10-CM

## 2018-09-06 NOTE — Telephone Encounter (Signed)
Rec'd request from [facility]. To schedule pt. for COVID 19 test, due to exposure to poss. Coronavirus.  Called pt's. wife; scheduled pt. for COVID 19 testing today at [facility].  Advised pt. Should wear a mask and remain in vehicle and follow instructions of the staff.  Verb. Understanding.

## 2018-09-12 ENCOUNTER — Telehealth: Payer: Self-pay | Admitting: *Deleted

## 2018-09-12 LAB — NOVEL CORONAVIRUS, NAA: SARS-CoV-2, NAA: NOT DETECTED

## 2018-09-12 NOTE — Telephone Encounter (Addendum)
Pt called because he has not received her COVID test results from testing on 09/06/2018; explained that it normally takes 48-72 hours for results, and 5/25/202 was a holiday; also explained that results may be delayed based on the volume of test that have to be completed; he verbalizes understanding.

## 2018-09-13 ENCOUNTER — Telehealth: Payer: Self-pay | Admitting: *Deleted

## 2018-09-13 NOTE — Telephone Encounter (Signed)
Reviewed negative Covid 19 results with patient and wife. No further questions at this time. No PCP on record.

## 2018-09-18 ENCOUNTER — Ambulatory Visit: Payer: Self-pay

## 2018-09-18 NOTE — Telephone Encounter (Signed)
Patient called and says he will need a copy of his negative covid test results to show to his employer so he can go back to work. He says he will also need a copy of his wife and 3 children's results as well, because they asked for everyone's in the house. I advised they can be mailed to him, he says they have moved and having trouble receiving mail, so he could come to the office and pick it up. I spoke with Nicole Cella, Patient Engagement Manager and he says to have the patient to stop by [facility] to pick it up and to call when he's at the front door of the building. I advised the patient of the address and number to call to pick up the requested results. He placed his wife on the phone to give me permission to print hers off and the children information. I will document in each patient's charts.

## 2019-06-21 ENCOUNTER — Encounter (HOSPITAL_COMMUNITY): Payer: Self-pay | Admitting: Emergency Medicine

## 2019-06-21 ENCOUNTER — Other Ambulatory Visit: Payer: Self-pay

## 2019-06-21 ENCOUNTER — Emergency Department (HOSPITAL_COMMUNITY)
Admission: EM | Admit: 2019-06-21 | Discharge: 2019-06-22 | Disposition: A | Discharge status: Home / Self Care | Payer: PRIVATE HEALTH INSURANCE | Attending: Emergency Medicine | Admitting: Emergency Medicine

## 2019-06-21 DIAGNOSIS — X509XXA Other and unspecified overexertion or strenuous movements or postures, initial encounter: Secondary | ICD-10-CM | POA: Diagnosis not present

## 2019-06-21 DIAGNOSIS — F17210 Nicotine dependence, cigarettes, uncomplicated: Secondary | ICD-10-CM | POA: Insufficient documentation

## 2019-06-21 DIAGNOSIS — Y929 Unspecified place or not applicable: Secondary | ICD-10-CM | POA: Diagnosis not present

## 2019-06-21 DIAGNOSIS — Y999 Unspecified external cause status: Secondary | ICD-10-CM | POA: Insufficient documentation

## 2019-06-21 DIAGNOSIS — Z79899 Other long term (current) drug therapy: Secondary | ICD-10-CM | POA: Insufficient documentation

## 2019-06-21 DIAGNOSIS — S39012A Strain of muscle, fascia and tendon of lower back, initial encounter: Secondary | ICD-10-CM | POA: Diagnosis not present

## 2019-06-21 DIAGNOSIS — Y9389 Activity, other specified: Secondary | ICD-10-CM | POA: Diagnosis not present

## 2019-06-21 DIAGNOSIS — S3992XA Unspecified injury of lower back, initial encounter: Secondary | ICD-10-CM | POA: Diagnosis present

## 2019-06-21 MED ORDER — IBUPROFEN 800 MG PO TABS: 800.0000 mg | ORAL_TABLET | Freq: Three times a day (TID) | ORAL | 0 refills | Status: DC

## 2019-06-21 MED ORDER — METHOCARBAMOL 500 MG PO TABS: 500.0000 mg | ORAL_TABLET | Freq: Two times a day (BID) | ORAL | 0 refills | Status: AC

## 2019-06-21 NOTE — ED Triage Notes (Signed)
Pt C/O back pain in the lumbar region. Pt state he was trying to "catch and washing machine falling off of a truck." Pt denies numbness in legs or arms. Pt denies loss of bowel or bladder.

## 2019-06-21 NOTE — ED Provider Notes (Signed)
[facility] EMERGENCY DEPARTMENT Provider Note   CSN: [id number] Arrival date & time: 06/21/19  2140     History Chief Complaint  Patient presents with  . Back Pain    Dustin Rhodes is a 34 y.o. male.  HPI Patient reports left lower back pain for the last 3 days since he injured it while trying to catch a washing machine was falling off a truck.  He reports some pain radiating down the back of his left leg but no numbness, tingling, weakness or problems with bowel or bladder function.  He has been taking Motrin with some improvement at home.  He had exacerbation of his pain today while he was at work.    Past Medical History:  Diagnosis Date  . Acid reflux   . ADHD (attention deficit hyperactivity disorder)     There are no problems to display for this patient.   Past Surgical History:  Procedure Laterality Date  . TYMPANOPLASTY    . WISDOM TOOTH EXTRACTION         Family History  Problem Relation Age of Onset  . Asthma Mother   . Migraines Mother   . Cancer Other   . Heart attack Other   . Alzheimer's disease Other     Social History   Tobacco Use  . Smoking status: Current Every Day Smoker    Packs/day: 1.00    Years: 10.00    Pack years: 10.00    Types: Cigarettes  . Smokeless tobacco: Former Network engineer Use Topics  . Alcohol use: Yes    Comment: once a month/beer  . Drug use: No    Home Medications Prior to Admission medications   Medication Sig Start Date End Date Taking? Authorizing Provider  Cyanocobalamin (VITAMIN B 12 PO) Take 1 tablet daily by mouth.    [provider]  ibuprofen (ADVIL) 800 MG tablet Take 1 tablet (800 mg total) by mouth 3 (three) times daily. 06/21/19   Truddie Hidden, MD  methocarbamol (ROBAXIN) 500 MG tablet Take 1 tablet (500 mg total) by mouth 2 (two) times daily. 06/21/19   Truddie Hidden, MD  niacin 500 MG tablet Take 500 mg at bedtime by mouth.    [provider]    Allergies      Goodys body pain [aspirin-acetaminophen] and Tramadol  Review of Systems   Review of Systems  Constitutional: Negative for fever.  HENT: Negative for congestion and sore throat.   Respiratory: Negative for cough and shortness of breath.   Cardiovascular: Negative for chest pain.  Gastrointestinal: Negative for abdominal pain, diarrhea, nausea and vomiting.  Genitourinary: Negative for dysuria.  Musculoskeletal: Positive for back pain. Negative for myalgias.  Skin: Negative for rash.  Neurological: Negative for headaches.  Psychiatric/Behavioral: Negative for behavioral problems.    Physical Exam Updated Vital Signs BP (!) 138/106 (BP Location: Right Arm)   Pulse 92   Temp 98.3 F (36.8 C) (Oral)   Resp 17   Wt 90.7 kg   SpO2 99%   BMI 30.41 kg/m   Physical Exam HENT:     Head: Normocephalic.     Nose: Nose normal.  Eyes:     Extraocular Movements: Extraocular movements intact.  Pulmonary:     Effort: Pulmonary effort is normal.  Musculoskeletal:        General: Normal range of motion.     Cervical back: Neck supple.     Comments: Tenderness in the left lumbar paraspinal  muscles  Skin:    Findings: No rash (on exposed skin).  Neurological:     Mental Status: He is alert and oriented to person, place, and time.     Sensory: No sensory deficit.     Motor: No weakness.     Gait: Gait normal.     Deep Tendon Reflexes: Reflexes normal.  Psychiatric:        Mood and Affect: Mood normal.     ED Results / Procedures / Treatments   Labs (all labs ordered are listed, but only abnormal results are displayed) Labs Reviewed - No data to display  EKG None  Radiology No results found.  Procedures Procedures (including critical care time)  Medications Ordered in ED Medications - No data to display  ED Course  I have reviewed the triage vital signs and the nursing notes.  Pertinent labs & imaging results that were available during my care of the patient were  reviewed by me and considered in my medical decision making (see chart for details).  Clinical Course as of Jun 21 2307  Fri Jun 21, 2019  2308 Patient with nonspecific musculoskeletal back pain, no red flags.  He is mostly here for work note but will also give a prescription for NSAIDs and muscle relaxers to take at home.   [CS]    Clinical Course User Index [CS] Pollyann Savoy, MD   Final Clinical Impression(s) / ED Diagnoses Final diagnoses:  Strain of lumbar region, initial encounter    Rx / DC Orders ED Discharge Orders         Ordered    ibuprofen (ADVIL) 800 MG tablet  3 times daily     06/21/19 2309    methocarbamol (ROBAXIN) 500 MG tablet  2 times daily     06/21/19 2309           Pollyann Savoy, MD 06/21/19 2310

## 2019-06-22 NOTE — ED Notes (Signed)
Pt ambulatory to waiting room. Pt verbalized understanding of discharge instructions.   

## 2019-07-03 ENCOUNTER — Emergency Department (HOSPITAL_COMMUNITY): Payer: PRIVATE HEALTH INSURANCE

## 2019-07-03 ENCOUNTER — Encounter (HOSPITAL_COMMUNITY): Payer: Self-pay | Admitting: *Deleted

## 2019-07-03 ENCOUNTER — Emergency Department (HOSPITAL_COMMUNITY)
Admission: EM | Admit: 2019-07-03 | Discharge: 2019-07-03 | Disposition: A | Discharge status: Home / Self Care | Payer: PRIVATE HEALTH INSURANCE | Attending: Emergency Medicine | Admitting: Emergency Medicine

## 2019-07-03 ENCOUNTER — Other Ambulatory Visit: Payer: Self-pay

## 2019-07-03 DIAGNOSIS — R1030 Lower abdominal pain, unspecified: Secondary | ICD-10-CM | POA: Diagnosis present

## 2019-07-03 DIAGNOSIS — R1110 Vomiting, unspecified: Secondary | ICD-10-CM

## 2019-07-03 DIAGNOSIS — F909 Attention-deficit hyperactivity disorder, unspecified type: Secondary | ICD-10-CM | POA: Diagnosis not present

## 2019-07-03 DIAGNOSIS — Z79899 Other long term (current) drug therapy: Secondary | ICD-10-CM | POA: Diagnosis not present

## 2019-07-03 DIAGNOSIS — R112 Nausea with vomiting, unspecified: Secondary | ICD-10-CM | POA: Insufficient documentation

## 2019-07-03 DIAGNOSIS — E860 Dehydration: Secondary | ICD-10-CM

## 2019-07-03 DIAGNOSIS — R197 Diarrhea, unspecified: Secondary | ICD-10-CM | POA: Insufficient documentation

## 2019-07-03 DIAGNOSIS — F17210 Nicotine dependence, cigarettes, uncomplicated: Secondary | ICD-10-CM | POA: Insufficient documentation

## 2019-07-03 LAB — COMPREHENSIVE METABOLIC PANEL
ALT: 21 U/L (ref 0–44)
AST: 20 U/L (ref 15–41)
Albumin: 4.6 g/dL (ref 3.5–5.0)
Alkaline Phosphatase: 40 U/L (ref 38–126)
Anion gap: 9 (ref 5–15)
BUN: 16 mg/dL (ref 6–20)
CO2: 26 mmol/L (ref 22–32)
Calcium: 9.3 mg/dL (ref 8.9–10.3)
Chloride: 106 mmol/L (ref 98–111)
Creatinine, Ser: 0.76 mg/dL (ref 0.61–1.24)
GFR calc Af Amer: >60 mL/min (ref >60)
GFR calc non Af Amer: >60 mL/min (ref >60)
Glucose, Bld: 110 mg/dL — ABNORMAL HIGH (ref 70–99)
Potassium: 3.7 mmol/L (ref 3.5–5.1)
Sodium: 141 mmol/L (ref 135–145)
Total Bilirubin: 0.8 mg/dL (ref 0.3–1.2)
Total Protein: 7.6 g/dL (ref 6.5–8.1)

## 2019-07-03 LAB — URINALYSIS, ROUTINE W REFLEX MICROSCOPIC
Bilirubin Urine: NEGATIVE
Glucose, UA: NEGATIVE mg/dL
Hgb urine dipstick: NEGATIVE
Ketones, ur: NEGATIVE mg/dL
Leukocytes,Ua: NEGATIVE
Nitrite: NEGATIVE
Protein, ur: NEGATIVE mg/dL
Specific Gravity, Urine: >1.046 — ABNORMAL HIGH (ref 1.005–1.030)
pH: 5 (ref 5.0–8.0)

## 2019-07-03 LAB — CBC WITH DIFFERENTIAL/PLATELET
Abs Immature Granulocytes: 0.04 10*3/uL (ref 0.00–0.07)
Basophils Absolute: 0 10*3/uL (ref 0.0–0.1)
Basophils Relative: 0 %
Eosinophils Absolute: 0.2 10*3/uL (ref 0.0–0.5)
Eosinophils Relative: 2 %
HCT: 44.3 % (ref 39.0–52.0)
Hemoglobin: 15 g/dL (ref 13.0–17.0)
Immature Granulocytes: 0 %
Lymphocytes Relative: 39 %
Lymphs Abs: 3.6 10*3/uL (ref 0.7–4.0)
MCH: 30.6 pg (ref 26.0–34.0)
MCHC: 33.9 g/dL (ref 30.0–36.0)
MCV: 90.4 fL (ref 80.0–100.0)
Monocytes Absolute: 0.6 10*3/uL (ref 0.1–1.0)
Monocytes Relative: 6 %
Neutro Abs: 4.8 10*3/uL (ref 1.7–7.7)
Neutrophils Relative %: 53 %
Platelets: 277 10*3/uL (ref 150–400)
RBC: 4.9 MIL/uL (ref 4.22–5.81)
RDW: 13.2 % (ref 11.5–15.5)
WBC: 9.3 10*3/uL (ref 4.0–10.5)
nRBC: 0 % (ref 0.0–0.2)

## 2019-07-03 LAB — LIPASE, BLOOD: Lipase: 25 U/L (ref 11–51)

## 2019-07-03 MED ORDER — IOHEXOL 300 MG/ML  SOLN
100.0000 mL | Freq: Once | INTRAMUSCULAR | Status: AC | PRN
  Administered 2019-07-03: 100 mL via INTRAVENOUS

## 2019-07-03 MED ORDER — SODIUM CHLORIDE 0.9 % IV BOLUS
1000.0000 mL | Freq: Once | INTRAVENOUS | Status: AC
  Administered 2019-07-03: 1000 mL via INTRAVENOUS

## 2019-07-03 NOTE — ED Triage Notes (Signed)
Pt states he was having intercourse with his wife this weekend and states he started having abdominal pain afterwards and was vomiting; pt went to urgent care yesterday and was told his spleen is enlarged and he needed to be seen

## 2019-07-03 NOTE — ED Provider Notes (Signed)
[facility] EMERGENCY DEPARTMENT Provider Note   CSN: [id number] Arrival date & time: 07/03/19  0220   Time seen 2:40 AM  History Chief Complaint  Patient presents with  . Abdominal Pain    Dustin Rhodes is a 34 y.o. male.  HPI   Patient states he and his wife were having sex on the evening of the 12th and immediately after he had pain in his rectum and lower abdominal cramping with pain in his testicles that lasted 2 to 3 hours.  He states he started having nausea and vomiting about 8 PM that night and the following day, the 13th he had vomiting about 8-9 times and diarrhea x3 that was described as loose.  He states he felt constipated however.  He states the following day, the 14th   he had vomiting x3 and diarrhea x1.  He denies any fever.  He also noted that on the 13th that he had some suprapubic pain when he urinated.  He states he and his wife had sex again on the evening of the 14th and again he had the pain in his rectum in his lower abdomen.  He states he does not know how long it lasted because he was able to go to sleep.  He states the second episode was not as bad as the first.  He states he went to urgent care last night, the 16th about 7 PM and they thought he had some splenomegaly.  He states he had noted some gas pain and bubbling in his left upper quadrant.  He states he is eating and drinking normally now however he did have a bowel movement tonight that was pale gray.  He states he is never done that before.  He states he has pain just above his umbilicus today.  He also states driving home he had double vision and his wife had to drive home.  He denies any IV drug use.  He states he has lost 40 pounds in 6 months because he was trying to lose weight.  PCP Patient, No Pcp Per   Past Medical History:  Diagnosis Date  . Acid reflux   . ADHD (attention deficit hyperactivity disorder)     There are no problems to display for this patient.   Past Surgical History:    Procedure Laterality Date  . TYMPANOPLASTY    . WISDOM TOOTH EXTRACTION         Family History  Problem Relation Age of Onset  . Asthma Mother   . Migraines Mother   . Cancer Other   . Heart attack Other   . Alzheimer's disease Other     Social History   Tobacco Use  . Smoking status: Current Every Day Smoker    Packs/day: 1.00    Years: 10.00    Pack years: 10.00    Types: Cigarettes  . Smokeless tobacco: Former Network engineer Use Topics  . Alcohol use: Yes    Comment: once a month/beer  . Drug use: No  employed  Home Medications Prior to Admission medications   Medication Sig Start Date End Date Taking? Authorizing Provider  Cyanocobalamin (VITAMIN B 12 PO) Take 1 tablet daily by mouth.    [provider]  ibuprofen (ADVIL) 800 MG tablet Take 1 tablet (800 mg total) by mouth 3 (three) times daily. 06/21/19   Truddie Hidden, MD  methocarbamol (ROBAXIN) 500 MG tablet Take 1 tablet (500 mg total) by mouth 2 (two) times  daily. 06/21/19   Pollyann Savoy, MD  niacin 500 MG tablet Take 500 mg at bedtime by mouth.    [provider]    Allergies    Goodys body pain [aspirin-acetaminophen] and Tramadol  Review of Systems   Review of Systems  All other systems reviewed and are negative.   Physical Exam Updated Vital Signs BP 116/76 (BP Location: Left Arm)   Pulse (!) 59   Temp 98.3 F (36.8 C) (Oral)   Resp 18   Ht 5\' 7"  (1.702 m)   Wt 90.7 kg   SpO2 100%   BMI 31.32 kg/m   Physical Exam Vitals and nursing note reviewed.  Constitutional:      General: He is not in acute distress.    Appearance: Normal appearance. He is well-developed. He is not ill-appearing or toxic-appearing.  HENT:     Head: Normocephalic and atraumatic.     Right Ear: External ear normal.     Left Ear: External ear normal.     Nose: Nose normal. No mucosal edema or rhinorrhea.     Mouth/Throat:     Mouth: Mucous membranes are dry.     Dentition: No dental  abscesses.     Pharynx: No uvula swelling.  Eyes:     Extraocular Movements: Extraocular movements intact.     Conjunctiva/sclera: Conjunctivae normal.     Pupils: Pupils are equal, round, and reactive to light.  Cardiovascular:     Rate and Rhythm: Normal rate and regular rhythm.     Heart sounds: Normal heart sounds. No murmur. No friction rub. No gallop.   Pulmonary:     Effort: Pulmonary effort is normal. No respiratory distress.     Breath sounds: Normal breath sounds. No wheezing, rhonchi or rales.  Chest:     Chest wall: No tenderness or crepitus.  Abdominal:     General: Bowel sounds are normal. There is no distension.     Palpations: Abdomen is soft.     Tenderness: There is abdominal tenderness. There is no guarding or rebound.    Genitourinary:    Penis: Normal.      Testes: Normal.     Comments: He has some mild discomfort to palpation of both testicles without masses felt.  They are similar in size.  He is not tender over his epididymis on either side. Musculoskeletal:        General: No tenderness. Normal range of motion.     Cervical back: Full passive range of motion without pain, normal range of motion and neck supple.     Comments: Moves all extremities well.   Skin:    General: Skin is warm and dry.     Coloration: Skin is not pale.     Findings: No erythema or rash.  Neurological:     General: No focal deficit present.     Mental Status: He is alert and oriented to person, place, and time.     Cranial Nerves: No cranial nerve deficit.  Psychiatric:        Mood and Affect: Mood is anxious.        Speech: Speech normal.        Behavior: Behavior normal.     ED Results / Procedures / Treatments   Labs (all labs ordered are listed, but only abnormal results are displayed) Results for orders placed or performed during the hospital encounter of 07/03/19  Urinalysis, Routine w reflex microscopic  Result Value Ref Range  Color, Urine YELLOW YELLOW    APPearance CLEAR CLEAR   Specific Gravity, Urine >1.046 (H) 1.005 - 1.030   pH 5.0 5.0 - 8.0   Glucose, UA NEGATIVE NEGATIVE mg/dL   Hgb urine dipstick NEGATIVE NEGATIVE   Bilirubin Urine NEGATIVE NEGATIVE   Ketones, ur NEGATIVE NEGATIVE mg/dL   Protein, ur NEGATIVE NEGATIVE mg/dL   Nitrite NEGATIVE NEGATIVE   Leukocytes,Ua NEGATIVE NEGATIVE  Comprehensive metabolic panel  Result Value Ref Range   Sodium 141 135 - 145 mmol/L   Potassium 3.7 3.5 - 5.1 mmol/L   Chloride 106 98 - 111 mmol/L   CO2 26 22 - 32 mmol/L   Glucose, Bld 110 (H) 70 - 99 mg/dL   BUN 16 6 - 20 mg/dL   Creatinine, Ser 7.98 0.61 - 1.24 mg/dL   Calcium 9.3 8.9 - 92.1 mg/dL   Total Protein 7.6 6.5 - 8.1 g/dL   Albumin 4.6 3.5 - 5.0 g/dL   AST 20 15 - 41 U/L   ALT 21 0 - 44 U/L   Alkaline Phosphatase 40 38 - 126 U/L   Total Bilirubin 0.8 0.3 - 1.2 mg/dL   GFR calc non Af Amer >60 >60 mL/min   GFR calc Af Amer >60 >60 mL/min   Anion gap 9 5 - 15  CBC with Differential  Result Value Ref Range   WBC 9.3 4.0 - 10.5 K/uL   RBC 4.90 4.22 - 5.81 MIL/uL   Hemoglobin 15.0 13.0 - 17.0 g/dL   HCT 19.4 17.4 - 08.1 %   MCV 90.4 80.0 - 100.0 fL   MCH 30.6 26.0 - 34.0 pg   MCHC 33.9 30.0 - 36.0 g/dL   RDW 44.8 18.5 - 63.1 %   Platelets 277 150 - 400 K/uL   nRBC 0.0 0.0 - 0.2 %   Neutrophils Relative % 53 %   Neutro Abs 4.8 1.7 - 7.7 K/uL   Lymphocytes Relative 39 %   Lymphs Abs 3.6 0.7 - 4.0 K/uL   Monocytes Relative 6 %   Monocytes Absolute 0.6 0.1 - 1.0 K/uL   Eosinophils Relative 2 %   Eosinophils Absolute 0.2 0.0 - 0.5 K/uL   Basophils Relative 0 %   Basophils Absolute 0.0 0.0 - 0.1 K/uL   Immature Granulocytes 0 %   Abs Immature Granulocytes 0.04 0.00 - 0.07 K/uL  Lipase, blood  Result Value Ref Range   Lipase 25 11 - 51 U/L   Laboratory interpretation all normal except very concentrated urine    EKG None  Radiology CT ABDOMEN PELVIS W CONTRAST  Result Date: 07/03/2019 CLINICAL DATA:  Vomiting  and diarrhea, rectal pain and lower abdominal pain after intercourse EXAM: CT ABDOMEN AND PELVIS WITH CONTRAST TECHNIQUE: Multidetector CT imaging of the abdomen and pelvis was performed using the standard protocol following bolus administration of intravenous contrast. CONTRAST:  OMNIPAQUE IOHEXOL 300 MG/ML  SOLN COMPARISON:  CT renal colic 12/18/2014 FINDINGS: Lower chest: Lung bases are clear. Normal heart size. No pericardial effusion. Hepatobiliary: No focal liver abnormality is seen. Gallbladder is largely decompressed at time of exam. No frank gallbladder wall thickening or visible calcified gallstones. No biliary ductal dilatation. Pancreas: Unremarkable. No pancreatic ductal dilatation or surrounding inflammatory changes. Spleen: Normal in size without focal abnormality. Adrenals/Urinary Tract: Adrenal glands are unremarkable. Kidneys are normal, without renal calculi, focal lesion, or hydronephrosis. Bladder is unremarkable. Stomach/Bowel: Distal esophagus is unremarkable. Stomach is distended with ingested material. Duodenum takes a normal sweep across  the abdomen. No small bowel dilatation or wall thickening. A normal appendix is visualized. (2/37) No colonic dilatation or wall thickening. Vascular/Lymphatic: The aorta is normal caliber. No suspicious or enlarged lymph nodes in the included lymphatic chains. Reproductive: The prostate and seminal vesicles are unremarkable. Included portions of the external genitalia are free of acute abnormality. Other: No abdominopelvic free fluid or free gas. No bowel containing hernias. 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 Rudimentary ribs noted at the lowest thoracic level. No acute or worrisome osseous lesions are seen. IMPRESSION: No CT evidence of acute abnormality or other plausible explanation for patient's abdominal pain. Electronically Signed   By: Kreg Shropshire M.D.   On: 07/03/2019 04:22    Procedures Procedures (including critical care time)  Medications Ordered in ED Medications  iohexol (OMNIPAQUE) 300 MG/ML solution 100 mL (100 mLs Intravenous Contrast Given 07/03/19 0402)  sodium chloride 0.9 % bolus 1,000 mL (1,000 mLs Intravenous New Bag/Given 07/03/19 0513)  sodium chloride 0.9 % bolus 1,000 mL (0 mLs Intravenous Stopped 07/03/19 6314)    ED Course  I have reviewed the triage vital signs and the nursing notes.  Pertinent labs & imaging results that were available during my care of the patient were reviewed by me and considered in my medical decision making (see chart for details).    MDM Rules/Calculators/A&P                     Laboratory testing was done and CT of the abdomen/pelvis was ordered.  4:50 AM patient was rechecked, we discussed his laboratory and radiology test results.  He still has not given [place] a urine sample yet.  He was given 1 L of normal saline, I suspect he is a little bit dehydrated from the vomiting and diarrhea he had over the weekend.   6:10 AM I went to talk to the patient about his urinalysis.  He does state his urine has been very dark.  I suspect he is very dehydrated from the vomiting and diarrhea that he had over the weekend.  He was given a second liter of normal saline.  We also discussed that the CT and lab work did not show any obvious reason for the pain he was having after sex.  It was suggested that he be evaluated at [facility] urology.  Final Clinical Impression(s) / ED Diagnoses Final diagnoses:  Dehydration  Vomiting and diarrhea    Rx / DC Orders ED Discharge Orders    None     Plan discharge  Devoria Albe, MD, Concha Pyo, MD 07/03/19 [phone]

## 2019-07-03 NOTE — ED Notes (Signed)
Patient transported to CT scan . 

## 2019-07-03 NOTE — Discharge Instructions (Addendum)
Drink more fluids, like sports drinks the next couple of days to correct your dehydration. Your tests tonight do not explain the pain you got after having sex. Consider getting an appointment at [facility] to have them evaluate your pain.

## 2019-07-10 ENCOUNTER — Ambulatory Visit (HOSPITAL_COMMUNITY)
Admission: AD | Admit: 2019-07-10 | Discharge: 2019-07-10 | Disposition: A | Discharge status: Home / Self Care | Payer: PRIVATE HEALTH INSURANCE | Attending: Psychiatry | Admitting: Psychiatry

## 2019-07-10 DIAGNOSIS — F1920 Other psychoactive substance dependence, uncomplicated: Secondary | ICD-10-CM | POA: Diagnosis not present

## 2019-07-10 DIAGNOSIS — F411 Generalized anxiety disorder: Secondary | ICD-10-CM | POA: Diagnosis not present

## 2019-07-10 DIAGNOSIS — F1520 Other stimulant dependence, uncomplicated: Secondary | ICD-10-CM | POA: Diagnosis not present
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
## 2022-02-12 IMAGING — CT CT ABD-PELV W/ CM
2 of 4 series · 16 of 46 positions shown, 18 images · IV contrast (Omnipaque or Isovue)
Comparison: CT renal colic 12/18/2014

CLINICAL DATA: Vomiting and diarrhea, rectal pain and lower
abdominal pain after intercourse

EXAM:
CT ABDOMEN AND PELVIS WITH CONTRAST
TECHNIQUE: Multidetector CT imaging of the abdomen and pelvis was performed
using the standard protocol following bolus administration of
intravenous contrast.
CONTRAST:  100mL OMNIPAQUE IOHEXOL 300 MG/ML  SOLN

[Series 2: axial st · axial · 0.72mm/px · z∈[+1046,+1526]mm · 13 of 109 slices shown, 15 images]
[im 7/109  soft-tissue]
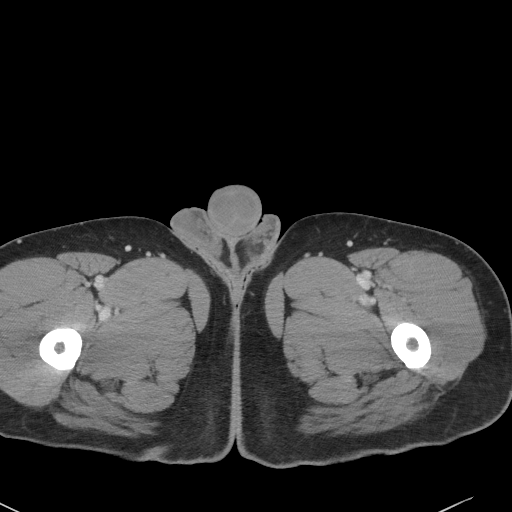
[im 7/109  bone]
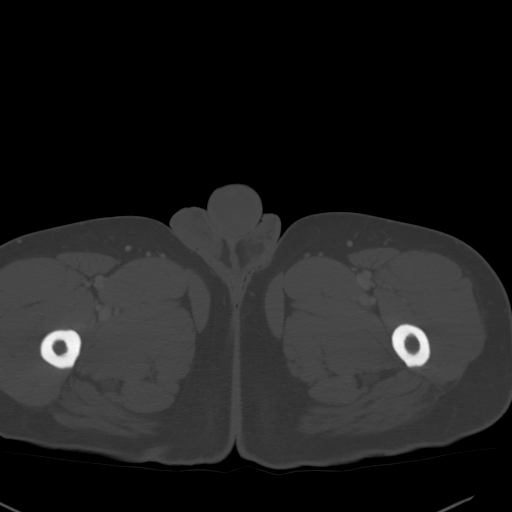
[im 13/109  soft-tissue]
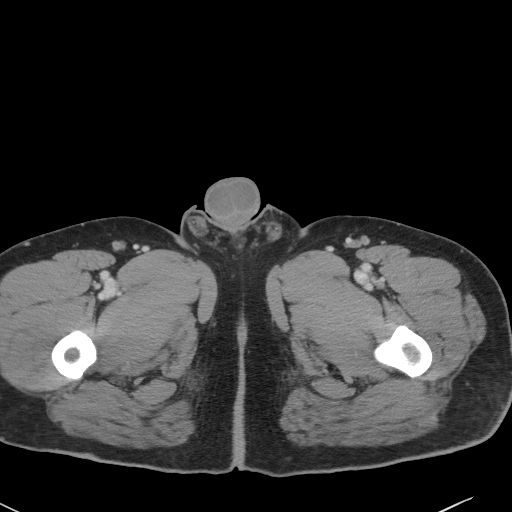
[im 25/109  soft-tissue]
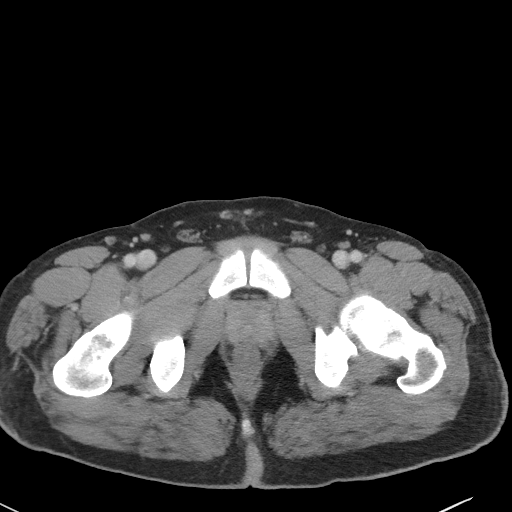
[im 31/109  soft-tissue]
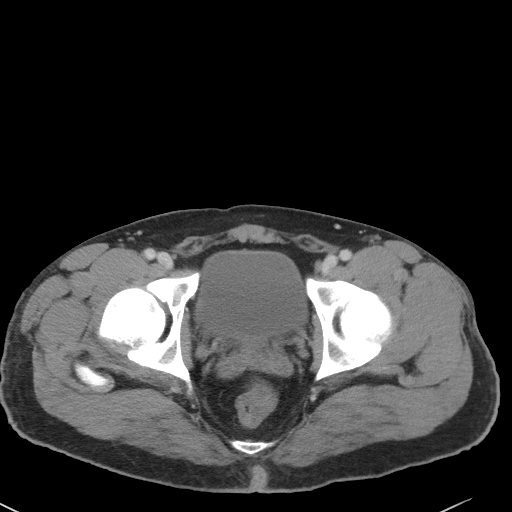
[im 37/109  soft-tissue]
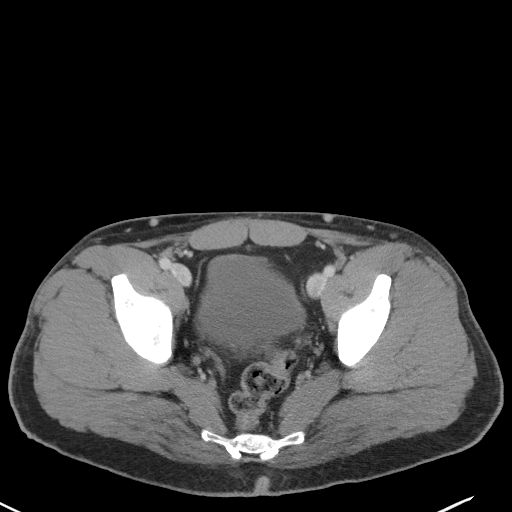
[im 49/109  soft-tissue]
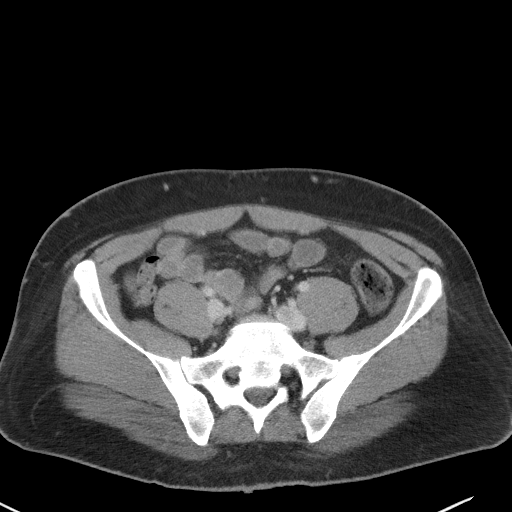
[im 55/109  soft-tissue]
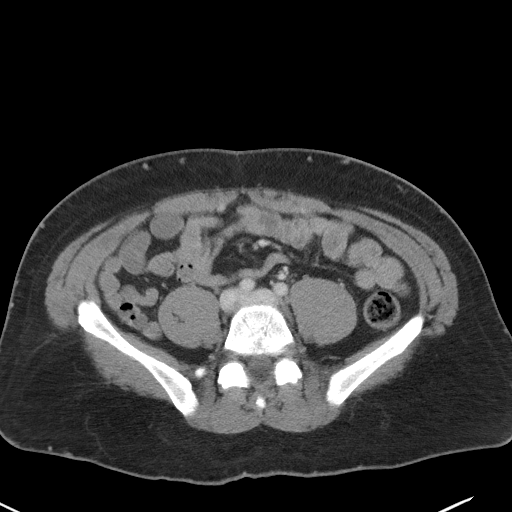
[im 61/109  soft-tissue]
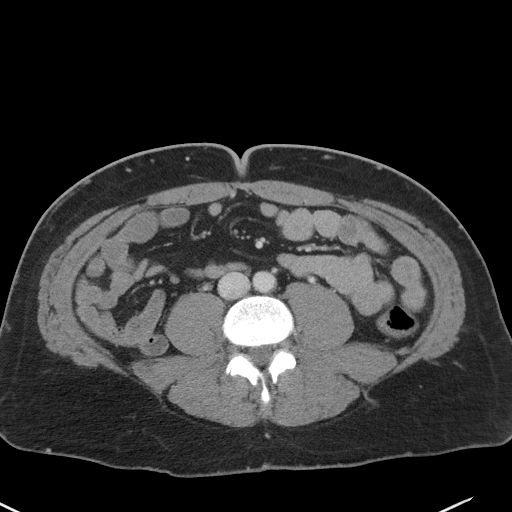
[im 73/109  soft-tissue]
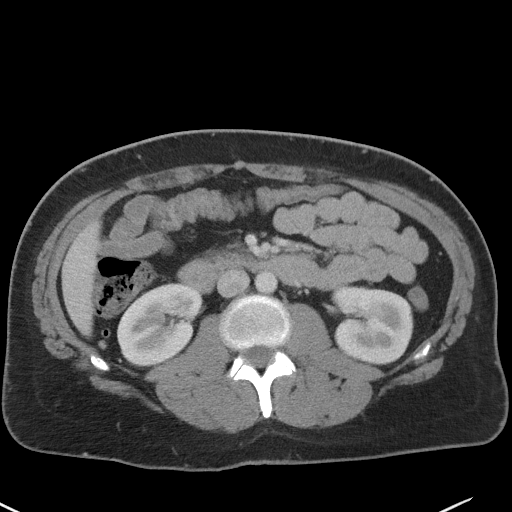
[im 73/109  bone]
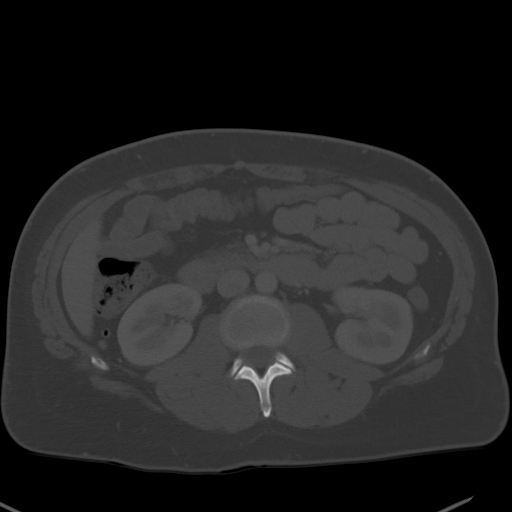
[im 79/109  soft-tissue]
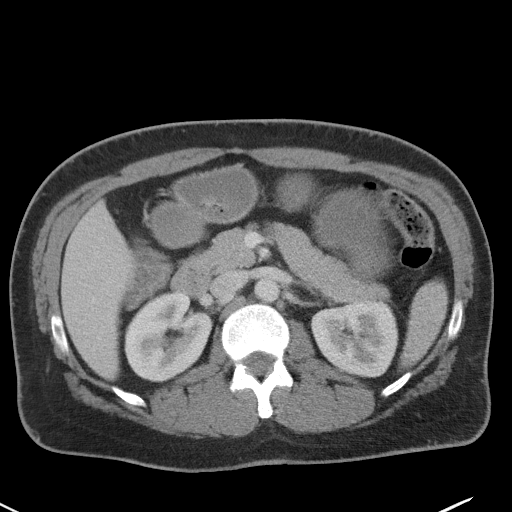
[im 85/109  soft-tissue]
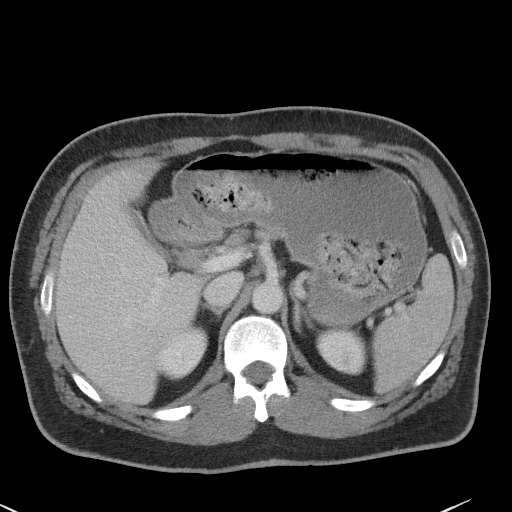
[im 97/109  soft-tissue]
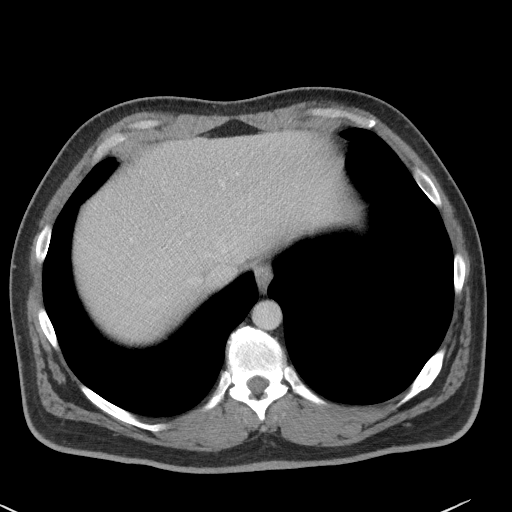
[im 103/109  soft-tissue]
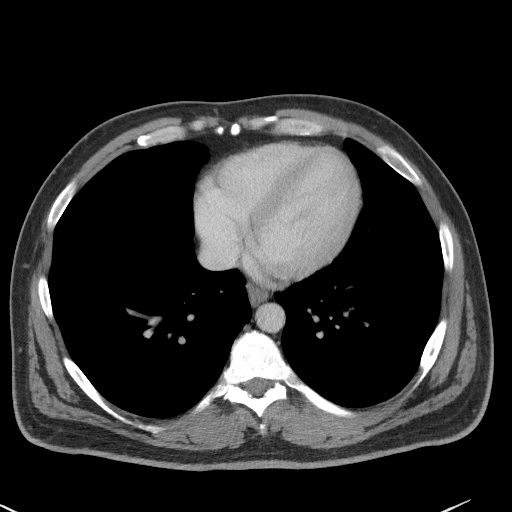

[Series 5: coronal st · coronal · 0.95mm/px · 3 of 85 slices shown]
[im 29/85  soft-tissue]
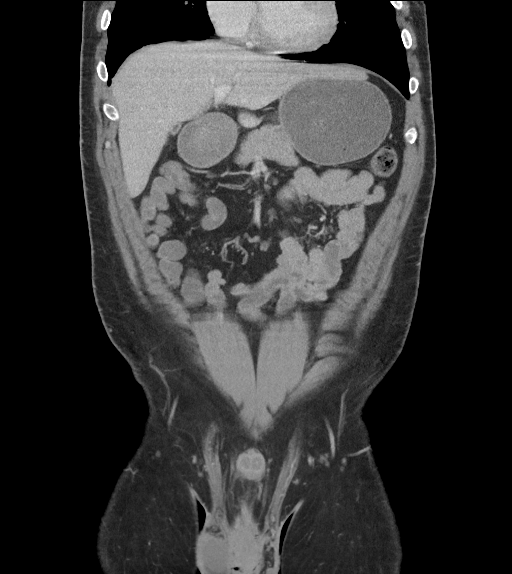
[im 38/85  soft-tissue]
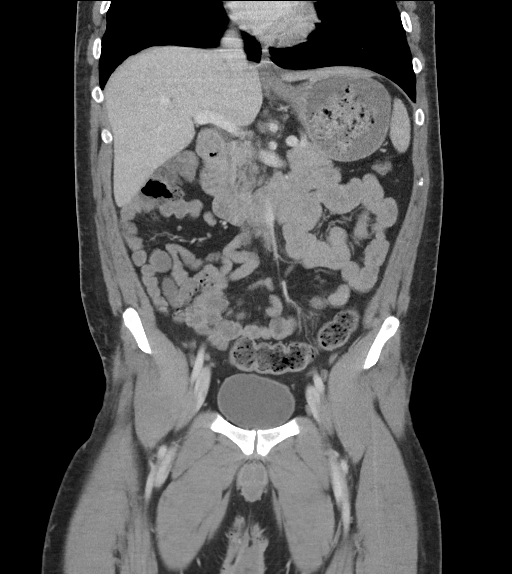
[im 47/85  soft-tissue]
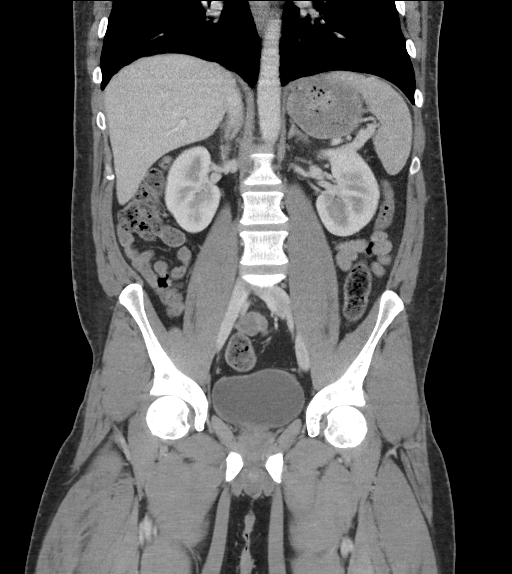

[16 of 46 positions shown; findings below may reference images not displayed]

FINDINGS: Lower chest: Lung bases are clear. Normal heart size. No pericardial
effusion.

Hepatobiliary: No focal liver abnormality is seen. Gallbladder is
largely decompressed at time of exam. No frank gallbladder wall
thickening or visible calcified gallstones. No biliary ductal
dilatation.

Pancreas: Unremarkable. No pancreatic ductal dilatation or
surrounding inflammatory changes.

Spleen: Normal in size without focal abnormality.

Adrenals/Urinary Tract: Adrenal glands are unremarkable. Kidneys are
normal, without renal calculi, focal lesion, or hydronephrosis.
Bladder is unremarkable.

Stomach/Bowel: Distal esophagus is unremarkable. Stomach is
distended with ingested material. Duodenum takes a normal sweep
across the abdomen. No small bowel dilatation or wall thickening. A
normal appendix is visualized. (2/37) No colonic dilatation or wall
thickening.

Vascular/Lymphatic: The aorta is normal caliber. No suspicious or
enlarged lymph nodes in the included lymphatic chains.

Reproductive: The prostate and seminal vesicles are unremarkable.
Included portions of the external genitalia are free of acute
abnormality.

Other: No abdominopelvic free fluid or free gas. No bowel containing
hernias.

Musculoskeletal: Congenital nonfusion of the posterior arch S1,
likely on a spina bifida occulta spectrum. Chronic left rib
deformities. Rudimentary ribs noted at the lowest thoracic level. No
acute or worrisome osseous lesions are seen.
IMPRESSION: No CT evidence of acute abnormality or other plausible explanation
for patient's abdominal pain.

## 2024-04-21 ENCOUNTER — Emergency Department (HOSPITAL_COMMUNITY)
Admission: EM | Admit: 2024-04-21 | Discharge: 2024-04-22 | Disposition: A | Discharge status: Home / Self Care | Payer: MEDICAID | Attending: Emergency Medicine | Admitting: Emergency Medicine

## 2024-04-21 ENCOUNTER — Other Ambulatory Visit: Payer: Self-pay

## 2024-04-21 ENCOUNTER — Encounter (HOSPITAL_COMMUNITY): Payer: Self-pay

## 2024-04-21 DIAGNOSIS — R110 Nausea: Secondary | ICD-10-CM | POA: Insufficient documentation

## 2024-04-21 DIAGNOSIS — N5082 Scrotal pain: Secondary | ICD-10-CM | POA: Insufficient documentation

## 2024-04-21 DIAGNOSIS — R1033 Periumbilical pain: Secondary | ICD-10-CM | POA: Diagnosis present

## 2024-04-21 LAB — COMPREHENSIVE METABOLIC PANEL WITH GFR
ALT: 15 U/L (ref 0–44)
AST: 22 U/L (ref 15–41)
Albumin: 4.6 g/dL (ref 3.5–5.0)
Alkaline Phosphatase: 46 U/L (ref 38–126)
Anion gap: 12 (ref 5–15)
BUN: 13 mg/dL (ref 6–20)
CO2: 25 mmol/L (ref 22–32)
Calcium: 9.7 mg/dL (ref 8.9–10.3)
Chloride: 104 mmol/L (ref 98–111)
Creatinine, Ser: 1 mg/dL (ref 0.61–1.24)
GFR, Estimated: >60 mL/min
Glucose, Bld: 91 mg/dL (ref 70–99)
Potassium: 4.1 mmol/L (ref 3.5–5.1)
Sodium: 141 mmol/L (ref 135–145)
Total Bilirubin: 0.5 mg/dL (ref 0.0–1.2)
Total Protein: 7.3 g/dL (ref 6.5–8.1)

## 2024-04-21 LAB — LIPASE, BLOOD: Lipase: 22 U/L (ref 11–51)

## 2024-04-21 LAB — URINALYSIS, ROUTINE W REFLEX MICROSCOPIC
Bacteria, UA: NONE SEEN
Bilirubin Urine: NEGATIVE
Glucose, UA: NEGATIVE mg/dL
Hgb urine dipstick: NEGATIVE
Ketones, ur: NEGATIVE mg/dL
Leukocytes,Ua: NEGATIVE
Nitrite: NEGATIVE
Protein, ur: NEGATIVE mg/dL
Specific Gravity, Urine: 1.019 (ref 1.005–1.030)
pH: 5 (ref 5.0–8.0)

## 2024-04-21 LAB — CBC
HCT: 44.8 % (ref 39.0–52.0)
Hemoglobin: 14.8 g/dL (ref 13.0–17.0)
MCH: 30 pg (ref 26.0–34.0)
MCHC: 33 g/dL (ref 30.0–36.0)
MCV: 90.9 fL (ref 80.0–100.0)
Platelets: 272 K/uL (ref 150–400)
RBC: 4.93 MIL/uL (ref 4.22–5.81)
RDW: 13.6 % (ref 11.5–15.5)
WBC: 7.5 K/uL (ref 4.0–10.5)
nRBC: 0 % (ref 0.0–0.2)

## 2024-04-21 NOTE — ED Notes (Addendum)
 UPON ENTERING ROOM PT AND SIG OTHER ASLEEP- WOKE PT UP TO ASSESS PAIN LEVEL

## 2024-04-21 NOTE — ED Provider Notes (Signed)
 " [place] EMERGENCY DEPARTMENT AT [facility] Provider Note   CSN: [id number] Arrival date & time: 04/21/24  1907     Patient presents with: Abdominal Pain   Dustin Rhodes is a 39 y.o. male.  {Add pertinent medical, surgical, social history, OB history to YEP:67052} The history is provided by the patient.  Abdominal Pain  He has history of attention deficit disorder, GERD and comes in with several complaints.  He states that for about the last 2-3 weeks, he has been having intermittent pain in the right testicle that feels like there is a string around it that is pulling.  For the last 4 days, he has been having episodes of periumbilical pain which is sharp and also episodes of pain in the right hip which are also sharp.  Sometimes these pains are together and sometimes they are separate.  The sharp pains will only last about 1 minute before resolving spontaneously.  There has been some nausea but no vomiting.  He denies any constipation or diarrhea and he denies any urinary difficulty.  He went to an urgent care center where he was advised to come here to make sure he did not have appendicitis.    Prior to Admission medications  Medication Sig Start Date End Date Taking? Authorizing Provider  Cyanocobalamin (VITAMIN B 12 PO) Take 1 tablet daily by mouth.    [provider]  ibuprofen  (ADVIL ) 800 MG tablet Take 1 tablet (800 mg total) by mouth 3 (three) times daily. 06/21/19   Roselyn Carlin NOVAK, MD  methocarbamol  (ROBAXIN ) 500 MG tablet Take 1 tablet (500 mg total) by mouth 2 (two) times daily. 06/21/19   Roselyn Carlin NOVAK, MD  niacin 500 MG tablet Take 500 mg at bedtime by mouth.    [provider]    Allergies: Goodys body pain [aspirin-acetaminophen ] and Tramadol     Review of Systems  Gastrointestinal:  Positive for abdominal pain.  All other systems reviewed and are negative.   Updated Vital Signs BP (!) 130/90   Pulse 100   Temp 98.5 F (36.9  C) (Oral)   Resp 19   Ht 5' 7 (1.702 m)   Wt 90.7 kg   SpO2 100%   BMI 31.32 kg/m   Physical Exam Vitals and nursing note reviewed.   39 year old male, resting comfortably and in no acute distress. Vital signs are normal. Oxygen saturation is 100%, which is normal. Head is normocephalic and atraumatic. PERRLA, EOMI.  Back is nontender and there is no CVA tenderness.  Straight leg raise is negative. Lungs are clear without rales, wheezes, or rhonchi. Chest is nontender. Heart has regular rate and rhythm without murmur. Abdomen is soft, flat, with a localized area of tenderness just to the right of the umbilicus.  There is no rebound or guarding. Genitalia: Circumcised penis, testes descended without masses.  There is induration and tenderness of the spermatic cord on the right but no swelling or tenderness of the epididymis.  No hernias palpable. Extremities have no cyanosis or edema, full range of motion is present. Skin is warm and dry without rash. Neurologic: Mental status is normal, cranial nerves are intact, moves all extremities equally.  (all labs ordered are listed, but only abnormal results are displayed) Labs Reviewed  LIPASE, BLOOD  COMPREHENSIVE METABOLIC PANEL WITH GFR  CBC  URINALYSIS, ROUTINE W REFLEX MICROSCOPIC    EKG: None  Radiology: No results found.  {Document cardiac monitor, telemetry assessment procedure when  appropriate:32947} Procedures   Medications Ordered in the ED - No data to display    {Click here for ABCD2, HEART and other calculators REFRESH Note before signing:1}                              Medical Decision Making Amount and/or Complexity of Data Reviewed Labs: ordered.   Right groin pain which seems to be localized to the spermatic cord, etiology is unclear but no evidence on exam of testicular torsion or epididymitis.  Intermittent abdominal and right hip pain of uncertain cause.  I have reviewed his laboratory tests, and my  interpretation is normal CBC and, normal comprehensive metabolic panel, normal lipase.  Urinalysis is pending.  Have ordered CT of abdomen and pelvis.  {Document critical care time when appropriate  Document review of labs and clinical decision tools ie CHADS2VASC2, etc  Document your independent review of radiology images and any outside records  Document your discussion with family members, caretakers and with consultants  Document social determinants of health affecting pt's care  Document your decision making why or why not admission, treatments were needed:32947:::1}   Final diagnoses:  None    ED Discharge Orders     None        "

## 2024-04-21 NOTE — ED Triage Notes (Signed)
 Pt to ED with c/o groin pain for a couple of weeks, then about 3 days ago RLQ started hurting, pt went to urgent care and they sent him here. Pt last BM was this morning.

## 2024-04-22 ENCOUNTER — Emergency Department (HOSPITAL_COMMUNITY): Payer: MEDICAID

## 2024-04-22 MED ORDER — DOXYCYCLINE HYCLATE 100 MG PO CAPS: 100.0000 mg | ORAL_CAPSULE | Freq: Two times a day (BID) | ORAL | 0 refills | Status: AC

## 2024-04-22 MED ORDER — IOHEXOL 300 MG/ML  SOLN
100.0000 mL | Freq: Once | INTRAMUSCULAR | Status: AC | PRN
  Administered 2024-04-22: 100 mL via INTRAVENOUS

## 2024-04-22 MED ORDER — NAPROXEN 500 MG PO TABS: 500.0000 mg | ORAL_TABLET | Freq: Two times a day (BID) | ORAL | 0 refills | Status: AC

## 2024-04-22 MED ORDER — DOXYCYCLINE HYCLATE 100 MG PO TABS
100.0000 mg | ORAL_TABLET | Freq: Once | ORAL | Status: AC
  Administered 2024-04-22: 100 mg via ORAL
  Filled 2024-04-22: qty 1

## 2024-04-22 MED ORDER — NAPROXEN 250 MG PO TABS
500.0000 mg | ORAL_TABLET | Freq: Once | ORAL | Status: AC
  Administered 2024-04-22: 500 mg via ORAL
  Filled 2024-04-22: qty 2

## 2024-04-22 NOTE — Discharge Instructions (Signed)
 Your evaluation today did not show the reason why you are having pain.  Please follow-up with the urologist for further evaluation.  Return to the emergency department if your symptoms are getting worse.

## 2024-04-22 NOTE — ED Notes (Signed)
 Pt back from CT

## 2024-04-22 NOTE — ED Notes (Signed)
 Ct CALLED TO INFORM PT WAS READY AND HAD IVP ACCESS

## 2024-04-22 NOTE — ED Notes (Signed)
 Pt taken to CT.
